# Patient Record
Sex: Male | Born: 1935 | Race: White | Hispanic: No | Marital: Married | Smoking: Never smoker
Health system: Southern US, Community
[De-identification: ages and names within clinical notes are randomized; demographics above are authoritative.]

## PROBLEM LIST (undated history)

## (undated) DIAGNOSIS — K5730 Diverticulosis of large intestine without perforation or abscess without bleeding: Secondary | ICD-10-CM

## (undated) DIAGNOSIS — M48061 Spinal stenosis, lumbar region without neurogenic claudication: Secondary | ICD-10-CM

## (undated) DIAGNOSIS — K219 Gastro-esophageal reflux disease without esophagitis: Secondary | ICD-10-CM

## (undated) DIAGNOSIS — I429 Cardiomyopathy, unspecified: Secondary | ICD-10-CM

## (undated) DIAGNOSIS — I2510 Atherosclerotic heart disease of native coronary artery without angina pectoris: Secondary | ICD-10-CM

## (undated) DIAGNOSIS — I10 Essential (primary) hypertension: Secondary | ICD-10-CM

## (undated) DIAGNOSIS — G43909 Migraine, unspecified, not intractable, without status migrainosus: Secondary | ICD-10-CM

## (undated) DIAGNOSIS — M1990 Unspecified osteoarthritis, unspecified site: Secondary | ICD-10-CM

## (undated) DIAGNOSIS — I493 Ventricular premature depolarization: Secondary | ICD-10-CM

## (undated) DIAGNOSIS — F419 Anxiety disorder, unspecified: Secondary | ICD-10-CM

## (undated) DIAGNOSIS — Z8679 Personal history of other diseases of the circulatory system: Secondary | ICD-10-CM

## (undated) DIAGNOSIS — Z87898 Personal history of other specified conditions: Secondary | ICD-10-CM

## (undated) DIAGNOSIS — E785 Hyperlipidemia, unspecified: Secondary | ICD-10-CM

## (undated) DIAGNOSIS — I472 Ventricular tachycardia: Secondary | ICD-10-CM

## (undated) DIAGNOSIS — K279 Peptic ulcer, site unspecified, unspecified as acute or chronic, without hemorrhage or perforation: Secondary | ICD-10-CM

## (undated) DIAGNOSIS — F329 Major depressive disorder, single episode, unspecified: Secondary | ICD-10-CM

## (undated) DIAGNOSIS — Z87442 Personal history of urinary calculi: Secondary | ICD-10-CM

## (undated) DIAGNOSIS — C4490 Unspecified malignant neoplasm of skin, unspecified: Secondary | ICD-10-CM

## (undated) DIAGNOSIS — C61 Malignant neoplasm of prostate: Secondary | ICD-10-CM

## (undated) DIAGNOSIS — F32A Depression, unspecified: Secondary | ICD-10-CM

## (undated) DIAGNOSIS — I700 Atherosclerosis of aorta: Secondary | ICD-10-CM

## (undated) DIAGNOSIS — R5383 Other fatigue: Secondary | ICD-10-CM

## (undated) HISTORY — PX: COLONOSCOPY: SHX174

## (undated) HISTORY — DX: Atherosclerotic heart disease of native coronary artery without angina pectoris: I25.10

## (undated) HISTORY — DX: Hyperlipidemia, unspecified: E78.5

## (undated) HISTORY — DX: Gastro-esophageal reflux disease without esophagitis: K21.9

## (undated) HISTORY — DX: Major depressive disorder, single episode, unspecified: F32.9

## (undated) HISTORY — DX: Ventricular premature depolarization: I49.3

## (undated) HISTORY — DX: Anxiety disorder, unspecified: F41.9

## (undated) HISTORY — DX: Essential (primary) hypertension: I10

## (undated) HISTORY — DX: Ventricular tachycardia: I47.2

## (undated) HISTORY — DX: Peptic ulcer, site unspecified, unspecified as acute or chronic, without hemorrhage or perforation: K27.9

## (undated) HISTORY — DX: Cardiomyopathy, unspecified: I42.9

## (undated) HISTORY — PX: CARDIAC CATHETERIZATION: SHX172

## (undated) HISTORY — DX: Depression, unspecified: F32.A

---

## 1997-07-15 ENCOUNTER — Observation Stay (HOSPITAL_COMMUNITY): Admission: RE | Admit: 1997-07-15 | Discharge: 1997-07-16 | Payer: Self-pay | Admitting: Urology

## 1998-01-14 HISTORY — PX: KIDNEY STONE SURGERY: SHX686

## 2008-09-06 ENCOUNTER — Telehealth (INDEPENDENT_AMBULATORY_CARE_PROVIDER_SITE_OTHER): Payer: Self-pay | Admitting: *Deleted

## 2008-09-06 ENCOUNTER — Encounter (INDEPENDENT_AMBULATORY_CARE_PROVIDER_SITE_OTHER): Payer: Self-pay | Admitting: *Deleted

## 2008-09-06 ENCOUNTER — Ambulatory Visit: Payer: Self-pay | Admitting: Cardiovascular Disease

## 2008-10-04 ENCOUNTER — Ambulatory Visit: Payer: Self-pay | Admitting: Cardiovascular Disease

## 2009-02-03 ENCOUNTER — Ambulatory Visit: Payer: Self-pay | Admitting: Cardiovascular Disease

## 2009-07-06 ENCOUNTER — Ambulatory Visit: Payer: Self-pay | Admitting: Cardiovascular Disease

## 2009-07-10 ENCOUNTER — Telehealth: Payer: Self-pay | Admitting: Cardiovascular Disease

## 2009-12-25 ENCOUNTER — Ambulatory Visit: Payer: Self-pay | Admitting: Cardiovascular Disease

## 2010-02-15 NOTE — Progress Notes (Signed)
  Phone Note Outgoing Call   Call placed by: Dessie Coma,  July 10, 2009 3:00 PM Call placed to: Patient Summary of Call: LMVM-notified patient per Dr. Freida Busman, Chest X-Ray was normal.

## 2010-05-23 ENCOUNTER — Other Ambulatory Visit: Payer: Self-pay | Admitting: Cardiovascular Disease

## 2010-05-24 ENCOUNTER — Other Ambulatory Visit: Payer: Self-pay | Admitting: *Deleted

## 2010-05-24 ENCOUNTER — Encounter: Payer: Self-pay | Admitting: Cardiovascular Disease

## 2010-05-24 MED ORDER — SIMVASTATIN 40 MG PO TABS: 40.0000 mg | ORAL_TABLET | Freq: Every evening | ORAL | Status: DC

## 2010-05-29 NOTE — Assessment & Plan Note (Signed)
[facility]                        [name]   Tristan Kennedy, Tristan Kennedy                      MRN:          [id number]  DATE:02/03/2009                            DOB:          Mar 17, 1934    PROBLEM LIST:  1. Nonobstructive coronary artery disease.  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  Most recent ejection      fraction in March 2010 showed ejection fraction of 50-55%, this      with mild mitral regurgitation and mild tricuspid regurgitation.  2. Hyperlipidemia.  3. Hypertension.  4. Gastroesophageal reflux disease.  5. Possible vitamin D deficiency.   INTERVAL HISTORY:  The patient states since his last visit, he has been  getting along quite well.  Overall, he feels better with myalgias in the  lower extremities after the decrease in his Coreg and simvastatin  dosages.  He continues to work full-time and occasionally experiences  discomfort in his lower legs after he has been walking up and down  ladders all day at his job.  Also, the patient states that he fell a  couple of weeks ago and has had some lower back pain for which his  primary care is following him for.  Other than that, the patient is  without complaint and has been compliant with this medications.   PHYSICAL EXAMINATION:  VITAL SIGNS:  Blood pressure is 115/68, pulse is  59, he weighs 201 pounds which is what he weighed in September.  He is  sating 96% on room air.  GENERAL:  He is in no acute distress.  HEENT:  Normocephalic, atraumatic.  NECK:  Supple.  No carotid bruit.  No JVD.  HEART:  Regular rate and rhythm without murmur, rub or gallop.  LUNGS:  Clear bilaterally.  ABDOMEN:  Soft, nontender, nondistended.  EXTREMITIES:  Without edema.  SKIN:  Warm and dry.  Pulses are 2+  bilateral carotid, radial and  dorsalis pedis.   I reviewed the patient's labs dated December 27, 2008, total cholesterol  158, HDL 44, triglycerides 141, LDL 86.   ASSESSMENT AND PLAN:  1. Nonobstructive coronary artery disease.  The patient is not having      any symptoms consistent with angina.  He should continue on aspirin      81 mg daily, statin, and beta-blocker.  I strongly encouraged the      patient to begin an exercise routine where he is performing some      sort of aerobic activity at least 30 minutes every day.  He      acknowledges this and states that he will make an effort in this      direction.  2. Hyperlipidemia.  I am happy with his LDL of 85 on simvastatin 40      mg.  He had some issues tolerating higher dose.  Because he still  occasionally gets some discomfort in his lower extremities after      using them all the day, we recommended a statin holiday for 2      weeks.  After the 2 weeks, he should restart the medication and see      if this provides any additional relief to his minimal lower      extremity discomfort.  He has excellent pulses in his periphery on      exam and I do not think that he has significant peripheral arterial      disease.  3. Hypertension.  His blood pressure is under excellent control and he      should continue on medications as listed in the chart.  We will see      the patient back in 4 months' time.     Brayton El, MD  Electronically Signed    SGA/MedQ  DD: 02/03/2009  DT: 02/04/2009  Job #: [id number]

## 2010-05-29 NOTE — Assessment & Plan Note (Signed)
Tristan Kennedy                        [facility] CARDIOLOGY OFFICE NOTE   Tristan Kennedy, Tristan Kennedy                      MRN:          [id number]  DATE:10/04/2008                            DOB:          [birth year]    PROBLEM LIST:  1. Hyperlipidemia with a history of Lipitor-induced myalgias  2. Hypertension.  3. Nonobstructive coronary artery disease, left heart catheterization      dated February 17, 2007, showed 25% mid left anterior descending      lesion, right dominant system, and an ejection fraction of 30% that      has been attributed to a sepsis-induced cardiomyopathy at that      time.  Most recent echocardiogram in March 2010 showed an ejection      fraction of 50-55%, mild mitral regurgitation, mild tricuspid      regurgitation, and normal pulmonary pressures.  4. Gastroesophageal reflux disease.  5. Possible B12 deficiency.   INTERVAL HISTORY:  The patient states since last visit, he has been  doing fairly well.  He stopped the 80 mg of simvastatin that he was  taking for 2 weeks.  He states that this improved the bilateral leg  aching that he was experiencing.  He then restarted the simvastatin at  half dose at 40 mg daily and states that the improvement that he  initially experienced did indeed continue.  He states he currently has  some bilateral leg aching at the end of the day after working hard.  However, it is better than it was before.  He states he does a lot of  work that involves climbing ladders, believes this is the likely  etiology.  He denies any chest pain, shortness of breath, syncope or  lower extremity edema.  He does bring in a blood pressure log today that  shows blood pressures ranging from 126/67 to 143/62, most of the  systolic blood pressures being in the 130s.  His heart rate during these  recordings range from 44-63 with several of the readings being in the  40s and low 50s.  Of note, the patient does state since he  was started  on the Coreg, he has felt mildly weak.  He also endorses occasional  dizziness if he raises his head up too quickly.   PHYSICAL EXAMINATION:  VITAL SIGNS:  The patient has blood pressure of  149/79; pulse is 48; weight is 201 pounds, just 2 pounds more than he  weighted a month ago; and he is sating 97% on room air.  GENERAL:  He is in no acute distress.  HEENT:  Nonfocal.  HEART:  Regular rate and rhythm without murmur, rub or gallop.  LUNGS:  Clear bilaterally.  ABDOMEN:  Soft, nontender, nondistended.  EXTREMITIES:  Without edema.   LABORATORY FINDINGS:  Labs from August 24 show a HDL of 39,  triglycerides 621, LDL of 82, total cholesterol 155.  His AST is 27, ALT  23, albumin 3.9, total protein 6.7, total bili 0.5.  His CPK was 58.   ASSESSMENT AND PLAN:  The patient is doing well from a cardiovascular  standpoint.  It appears that he is tolerating the lower dose of  simvastatin much better than the higher dose, so we will continue him at  the 40 mg daily.  His LFTs and CPK were totally within normal limits  when he was feeling the myalgias initially.  The patient's vital sign  log from home indicates that he is mildly bradycardic and the patient  does have some occasional associated dizziness and weakness from it.  Therefore, we will decrease his Coreg from 6.25 to 3.125 mg b.i.d.  He  should continue on his other medications as listed in the chart.  I will  see the patient back in 3 months' time with followup CPK and fasting  lipids.     Brayton El, MD  Electronically Signed    SGA/MedQ  DD: 10/04/2008  DT: 10/05/2008  Job #: [id number]

## 2010-05-29 NOTE — Assessment & Plan Note (Signed)
[facility]                        [place] CARDIOLOGY OFFICE NOTE   Tristan Kennedy, Tristan Kennedy                      MRN:          [id number]  DATE:09/06/2008                            DOB:          07-11-1934    CHIEF COMPLAINT:  Muscle aches.   HISTORY OF PRESENT ILLNESS:  Tristan Kennedy is a 76 year old white male with  a past medical history significant for hypertension, hyperlipidemia,  myalgias that had been attributed to statin use, frequent PVCs on a  stress EKG, and minimal nonobstructive coronary artery disease who is  presenting with a several-week history of increased muscle aches.  The  patient states that oftentimes at the end of the day he feels muscle  aching in his bilateral thighs, right greater than left, and some in the  front of his legs.  More recently his bilateral arms have also been  aching mildly at the end of the day.  He states that these muscle aches  seem to coincide with an increase in his simvastatin from 40 mg to 80 mg  nightly.  Of note, the patient states he had problems tolerating Lipitor  in the past and that every time Lipitor was instituted he had whole body  aches that he describes as worse than the muscle aches that he has been  experiencing over the past several weeks.  The patient denies any chest  discomfort, shortness of breath, dyspnea on exertion, lower extremity  edema, PND, orthopnea, or syncope.  The patient states that the only  recent change in his medication other then an increase in simvastatin  was an increase in his fish oil dose.  The patient states he was suppose  to increase his Coreg, however did not do so because he has also been  experiencing some mild generalized weakness and did not want to increase  the weakness by increasing his dose of beta-blocker.   PAST MEDICAL HISTORY:  As above in the HPI.  In addition, the patient  has a history of diverticulitis and was admitted to the hospital with a  flare-up of this in March of 2010.  He carries a history of  cardiomyopathy that was associated with sepsis, however, a 2-D  echocardiogram in March of this year demonstrated an ejection fraction  of 50-55% with impaired relaxation, mild MR, mild TR, and pulmonary  pressure estimated to be normal.  The patient also carries a diagnosis  of gastroesophageal reflux disease, possible B12 deficiency, history of  elevated LFTs that appeared to be non-viral in etiology.  The patient  has a history of non-sustained v-tach on a Holter monitor he wore in  February of 2009, of note at this time his ejection fraction was 30%.   SOCIAL HISTORY:  No tobacco and very rare alcohol use.   FAMILY HISTORY:  Negative for premature coronary artery disease.   ALLERGIES:  NO KNOWN DRUG ALLERGIES.   MEDICATIONS:  1. Aspirin 81mg  daily.  2. Coreg 6.25 mg b.i.d.  3. Moexipril/HCTZ 15/12.5 mg 1 tablet daily.  4. Fish oil 1200 mg 3 tablets daily.  5. Omeprazole 20 mg daily.  6. Ranitidine 300 mg at bedtime.  7. Sertraline 50 mg b.i.d.  8. Trazodone 150 mg at bedtime.  9. Multivitamin.  10.Magnesium.  11.Hydrocodone p.r.n.  12.Alprazolam p.r.n.  13.Pro-Air 90 mcg 2 puffs q.4 hours p.r.n.   REVIEW OF SYSTEMS:  As in HPI.  All other systems were reviewed and are  negative.  The patient does complain of somewhat of a chronic cough;  however, the cough only occurs in the morning upon waking and in the  late evenings.   PHYSICAL EXAMINATION:  VITAL SIGNS: Blood pressure 134/82, pulse 58,  weighs 199 pounds, sating 95% on room air.  GENERAL:  In no acute distress.  HEENT:  Nonfocal.  NECK:  Supple.  There is no JVD.  There are no carotid bruits.  There is  no lymphadenopathy.  HEART:  Regular rate and rhythm without murmur, rub, or gallop.  LUNGS:  Clear to auscultation bilaterally.  ABDOMEN: Soft, nontender, and nondistended.  There are no abdominal  bruits present.  EXTREMITIES:  No edema.  SKIN:  Warm  and dry.  PULSES:  The patient has 2+ bilateral carotid, radial, popliteal, and  posterior tibial pulses.  NEURO:  Nonfocal.  PSYCH:  The patient is appropriate with normal levels of insight.   LABORATORY DATA:  EKG independently reviewed by myself demonstrates a  mild sinus bradycardia at a rate of 58 beats per minute.  There are no  significant ST or T-waves abnormalities.  Review of the patient's labs  from May of this year shows a HDL of 30, a LDL of 96, triglyceride level  of 329, a total cholesterol of 192.  Labs from July of this year shows a  CBC that had a white count of 5, hemoglobin of 14, and a platelet count  of 132.  His BMP at that time was completely within normal limits  including a creatinine of 1.2.  His LFTs showed an ALT of 24, AST of 32,  albumin 4.2, alkaline phosphatase 67.  TSH was 1.9.  Review of patient's  transthoracic echocardiogram as above in HPI.  Review of the patient's  heart catheterization report from February 17, 2007 showed the patient  had a mid LAD 25% eccentric lesion.  There is a right dominant system.  EF at that time was 30% (that had been attributed to sepsis-induced  cardiomyopathy).  Review of a Holter monitor dated February of 2009  showed a max heart rate of 87, minimal heart rate of 36 at 3:30 in the  morning, average heart rate of 55.  There were no significant pauses.  There were frequent PVCs and brief episodes of non-sustained v-tach.   ASSESSMENT:  This is a 76 year old white male with a history of systolic  dysfunction associated with a sepsis per report.  His ejection fraction  has now normalized.  He is complaining of muscle aches that may be  secondary to a statin-induced myopathy. Other then the muscle aches  patient is getting along quite well.  He is not having any signs or  symptoms consistent with obstructive coronary artery disease or  malignant arrhythmia. He is on a good medical regimen and his left  ventricular ejection  fraction is normal.   PLAN:  Today in clinic we will ask the patient to stop his simvastatin  for two weeks.  At that time, he should restart it, however, at a dose  of 40 mg every evening as oppose to the 80 mg that he is taking now.  Today we will check a fasting lipid profile, a total CPK, and LFTs.  He  should continue on his current medical regimen as listed above and he  should also remain well hydrated.  Of note, the patient does have a  cough and is on an ACE inhibitor; however, this cough is more likely to  be related to the reflux as it occurs in the very early morning and  often at night after a meal.  If his cough was to worsen or persist  chronically we would consider altering his anti-hypertensive regimen.     Brayton El, MD  Electronically Signed    SGA/MedQ  DD: 09/06/2008  DT: 09/06/2008  Job #: [id number]

## 2010-05-29 NOTE — Assessment & Plan Note (Signed)
[facility]                         CARDIOLOGY OFFICE NOTE   Tristan Kennedy, Tristan Kennedy                      MRN:          [id number]  DATE:07/06/2009                            DOB:          03-Jan-1935    PROBLEMS LIST:  1. Nonobstructive coronary artery disease.  Left heart catheterization      dated February 17, 2007, showed mid left anterior descending      coronary artery 25%.  Ejection fraction was decreased at that time;      however, the most recent echo in March 2010 showed an ejection      fraction of 50-55%.  The initial echo showing systolic dysfunction,      was thought to be secondary to sepsis-induced cardiomyopathy.  2. Hyperlipidemia.  3. Hypertension.  4. Gastroesophageal reflux disease.  5. Possible vitamin D deficiency.   INTERVAL HISTORY:  The patient states that he has been getting along  well.  He continues to work full-time and has no difficulty with his  activities.  He states that he is very active during work.  He denies  any chest discomfort, shortness of breath, or discomfort in his  extremities.  The patient does endorse approximate 74-month history of  evening cough.  He states that the cough often starts around 4:30 in the  afternoon, continues to the evening, it is nonproductive, and it does  not occur everyday.  Of note, he has been on an ACE inhibitor for  several years without difficulty.   SOCIAL HISTORY:  The patient does not smoke tobacco.   PHYSICAL EXAMINATION:  VITAL SIGNS:  Weight is 203, blood pressure is  130/82, pulse is 52, saturating 97% on room air.  GENERAL:  No acute distress.  HEENT:  Normocephalic, atraumatic.  NECK:  Supple.  There is no JVD.  No carotid bruits.  HEART:  Regular rate and rhythm without murmur, rub, or gallop.  LUNGS:  Clear bilaterally.  ABDOMEN:  Soft, nontender, nondistended.  EXTREMITIES:  Without edema.  SKIN:  Warm and dry.  PSYCHIATRIC:  The patient is  appropriate.   The patient's last lipid profile in December 2010 showed a HDL 44, LDL  86, total cholesterol 158, triglycerides 141.   ASSESSMENT AND PLAN:  1. Nonobstructive coronary disease.  The patient is not having any      symptoms consistent with angina, arrhythmia, or heart failure.  He      should continue on aspirin 81 mg daily, simvastatin, low-dose      Coreg, beta-blocker, and fish oil.  2. Cough.  I think unlikely that the patient's cough is secondary to      his ACE inhibitor, however, we will stop this medication for 1      weeks' time.  If the cough resolves, he will contact our office and      we will choose a different antihypertensive agent.  Of note, he is      currently taking moexipril/hydrochlorothiazide 15/12.5 two tablets      daily.  I will check a PA and lateral chest x-ray.  The patient has  some history of potential asbestos exposure.  He also has      gastroesophageal reflux disease, but if the alteration of his ACE      inhibitor does not help, it may be worthwhile increasing his      omeprazole to b.i.d..  He should contact his primary care Tristan Kennedy      regarding this.  3. Hyperlipidemia.  He is not having any myalgias with the simvastatin      40 mg daily and his last LDL was 85 which is reasonable.  He should      continue on this current dose.  4. Hypertension.  His blood pressure is under good control.  He should      continue the current medications except for the above alteration as      above related to his cough.  The patient will follow up in 6      months' time.     Brayton El, MD  Electronically Signed    SGA/MedQ  DD: 07/06/2009  DT: 07/06/2009  Job #: [id number]

## 2010-08-03 ENCOUNTER — Other Ambulatory Visit: Payer: Self-pay | Admitting: Cardiovascular Disease

## 2010-12-26 ENCOUNTER — Encounter: Payer: Self-pay | Admitting: Cardiovascular Disease

## 2014-08-15 DIAGNOSIS — E785 Hyperlipidemia, unspecified: Secondary | ICD-10-CM | POA: Insufficient documentation

## 2014-08-15 DIAGNOSIS — I10 Essential (primary) hypertension: Secondary | ICD-10-CM

## 2014-08-15 DIAGNOSIS — I493 Ventricular premature depolarization: Secondary | ICD-10-CM

## 2014-08-15 DIAGNOSIS — I2510 Atherosclerotic heart disease of native coronary artery without angina pectoris: Secondary | ICD-10-CM

## 2014-08-15 HISTORY — DX: Atherosclerotic heart disease of native coronary artery without angina pectoris: I25.10

## 2014-08-15 HISTORY — DX: Ventricular premature depolarization: I49.3

## 2014-08-15 HISTORY — DX: Hyperlipidemia, unspecified: E78.5

## 2014-08-15 HISTORY — DX: Essential (primary) hypertension: I10

## 2015-10-13 ENCOUNTER — Other Ambulatory Visit: Payer: Self-pay | Admitting: Rehabilitation

## 2015-10-13 DIAGNOSIS — M5126 Other intervertebral disc displacement, lumbar region: Secondary | ICD-10-CM

## 2015-10-24 ENCOUNTER — Ambulatory Visit
Admission: RE | Admit: 2015-10-24 | Discharge: 2015-10-24 | Disposition: A | Discharge status: Home / Self Care | Payer: Medicare Other | Source: Ambulatory Visit | Attending: Rehabilitation | Admitting: Rehabilitation

## 2015-10-24 DIAGNOSIS — M5126 Other intervertebral disc displacement, lumbar region: Secondary | ICD-10-CM

## 2016-08-14 ENCOUNTER — Ambulatory Visit (INDEPENDENT_AMBULATORY_CARE_PROVIDER_SITE_OTHER): Payer: Medicare Other

## 2016-08-14 ENCOUNTER — Ambulatory Visit (INDEPENDENT_AMBULATORY_CARE_PROVIDER_SITE_OTHER): Payer: Medicare Other | Admitting: Orthopaedic Surgery

## 2016-08-14 DIAGNOSIS — G8929 Other chronic pain: Secondary | ICD-10-CM | POA: Diagnosis not present

## 2016-08-14 DIAGNOSIS — M25511 Pain in right shoulder: Secondary | ICD-10-CM

## 2016-08-14 MED ORDER — METHYLPREDNISOLONE ACETATE 40 MG/ML IJ SUSP
40.0000 mg | INTRAMUSCULAR | Status: AC | PRN
  Administered 2016-08-14: 40 mg via INTRA_ARTICULAR

## 2016-08-14 MED ORDER — LIDOCAINE HCL 1 % IJ SOLN
3.0000 mL | INTRAMUSCULAR | Status: AC | PRN
  Administered 2016-08-14: 3 mL

## 2016-08-14 NOTE — Progress Notes (Signed)
Office Visit Note   Patient: Tristan Kennedy           Date of Birth: 1934-03-06           MRN: [id number] Visit Date: 08/14/2016              Requested by: No referring provider defined for this encounter. PCP: Default, Provider, MD   Assessment & Plan: Visit Diagnoses:  1. Chronic right shoulder pain     Plan: He is definitely want to try steroid injection in his right shoulder. I agree with this as well. I talked about physical therapy as well but he also try to see what the injection does. He'll follow up as needed however if 3-4 weeks ago by me still having pain and not improving his shoulder range of motion I would want him to call so we can set him up for outpatient physical therapy for his right shoulder. All questions were encouraged and answered.  Follow-Up Instructions: Return in about 4 weeks (around 09/11/2016).   Orders:  Orders Placed This Encounter  Procedures  . Large Joint Injection/Arthrocentesis  . Large Joint Injection/Arthrocentesis  . XR Shoulder Right   No orders of the defined types were placed in this encounter.     Procedures: Large Joint Inj Date/Time: 08/14/2016 11:03 AM Performed by: Mcarthur Rossetti Authorized by: Mcarthur Rossetti   Location:  Shoulder Site:  R subacromial bursa Ultrasound Guidance: No   Fluoroscopic Guidance: No   Arthrogram: No   Medications:  3 mL lidocaine 1 %; 40 mg methylPREDNISolone acetate 40 MG/ML     Clinical Data: No additional findings.   Subjective: No chief complaint on file. The patient comes in today with his family. I know his family well. He is someone who slipped and fell on the ice back in January injuring his right shoulder. He is tried leg go for long period time it has gotten word hurts him with overhead activities and reaching behind him. He denies any neck pain denies any radicular symptoms going down the right arm is just in his shoulder where he points to pain. He said reaching  behind him and overhead causes the most problems. He says there is some weakness as well.  HPI  Review of Systems He denies any headache, chest pain, shortness of breath, fever, chills, nausea, vomiting.  Objective: Vital Signs: There were no vitals taken for this visit.  Physical Exam He is alert and oriented 3 and in no acute distress Ortho Exam Examination of his right shoulder shows that he's not having to use his deltoids to abduct the shoulder but there is definitely weakness with abduction as well as external rotation. His internal rotation with adduction is only just above the lumbar spine area were incised full on his non- injured left side. His neck exam is normal. His distal motor and sensory exam in his right upper extremity is normal. He does have a negative liftoff of the right shoulder as well. Specialty Comments:  No specialty comments available.  Imaging: Xr Shoulder Right  Result Date: 08/14/2016 3 views of the right shoulder show no acute findings. The shoulders well located. The humeral head is in a central position in the glenoid. There is no evidence of rotator cuff arthropathy.    PMFS History: There are no active problems to display for this patient.  Past Medical History:  Diagnosis Date  . CAD (coronary artery disease)   . GERD (gastroesophageal reflux disease)   .  HLD (hyperlipidemia)   . HTN (hypertension)     Family History  Problem Relation Age of Onset  . Stroke Unknown     Past Surgical History:  Procedure Laterality Date  . KIDNEY STONE SURGERY  2000   Social History   Occupational History  . maintance    Social History Main Topics  . Smoking status: Former Smoker    Quit date: 01/14/1961  . Smokeless tobacco: Not on file  . Alcohol use Not on file  . Drug use: Unknown  . Sexual activity: Not on file

## 2016-08-28 ENCOUNTER — Other Ambulatory Visit: Payer: Self-pay

## 2016-08-28 MED ORDER — SIMVASTATIN 40 MG PO TABS: 40.0000 mg | ORAL_TABLET | Freq: Every day | ORAL | 1 refills | Status: DC

## 2016-09-11 ENCOUNTER — Ambulatory Visit (INDEPENDENT_AMBULATORY_CARE_PROVIDER_SITE_OTHER): Payer: Medicare Other | Admitting: Physician Assistant

## 2016-09-11 DIAGNOSIS — M25511 Pain in right shoulder: Secondary | ICD-10-CM | POA: Diagnosis not present

## 2016-09-11 NOTE — Progress Notes (Signed)
Mr. Tristan Kennedy returns today follow-up of his right shoulder status post subacromial injection. He states injection really was helpful. He is sleeping better. He is still mindful of the shoulder mostly uses left arm. He denies any pain in the arm this point in time. He did do some of the exercises that were shown to him by Dr. Ninfa Linden.  Physical exam: Bilateral shoulders he has 5 out of 5 strengths with internal rotation against resistance. 4 out of 5 strengths with external rotation on the right 5 out of 5 on the left. Positive impingement sign on the right. Forward flexion of the right shoulder without significant pain.  Impression: Right shoulder impingement  Plan: Discussed with him some formal physical therapy defers. Therefore he is given therapy band and review some external and internal rotation exercises with him. Continue the other exercises shown by Dr. Ninfa Linden. See him back on an as-needed basis. If his pain persist or becomes worse we'll obtain an MRI to evaluate his rotator cuff. Questions encouraged and answered length today.

## 2016-12-12 ENCOUNTER — Other Ambulatory Visit: Payer: Self-pay

## 2016-12-12 DIAGNOSIS — I429 Cardiomyopathy, unspecified: Secondary | ICD-10-CM

## 2016-12-12 DIAGNOSIS — I2510 Atherosclerotic heart disease of native coronary artery without angina pectoris: Secondary | ICD-10-CM | POA: Insufficient documentation

## 2016-12-12 DIAGNOSIS — I472 Ventricular tachycardia, unspecified: Secondary | ICD-10-CM

## 2016-12-12 DIAGNOSIS — I4729 Other ventricular tachycardia: Secondary | ICD-10-CM

## 2016-12-12 HISTORY — DX: Ventricular tachycardia: I47.2

## 2016-12-12 HISTORY — DX: Other ventricular tachycardia: I47.29

## 2016-12-12 HISTORY — DX: Cardiomyopathy, unspecified: I42.9

## 2016-12-12 HISTORY — DX: Ventricular tachycardia, unspecified: I47.20

## 2016-12-13 NOTE — Progress Notes (Signed)
Cardiology Office Note:    Date:  12/16/2016   ID:  Tristan Kennedy, DOB 1934-02-18, MRN [id number]  PCP:  Cyndy Freeze, MD  Cardiologist:  Shirlee More, MD    Referring MD: Cyndy Freeze, MD    ASSESSMENT:    1. Mild CAD   2. Frequent PVCs   3. Essential hypertension   4. Hyperlipidemia, unspecified hyperlipidemia type    PLAN:    In order of problems listed above:  1. Stable, I requested a copy of his coronary angiogram done 5-10 years ago he is having no anginal discomfort he will continue his current medical treatment.  At this time I would not advise an ischemia evaluation 2. Stable he has little or no palpitation tolerates minimal dose of beta-blocker ACE peel-off 5 days a week with home heart rates running greater than 50-60 bpm. 3. Stable blood pressure target continue current treatment including ACE inhibitor diuretic and check renal function today 4. Stable continue statin CAD check lipid profile I will send a copy of his labs to his PCP    Next appointment: One year   Medication Adjustments/Labs and Tests Ordered: Current medicines are reviewed at length with the patient today.  Concerns regarding medicines are outlined above.  Orders Placed This Encounter  Procedures  . Comprehensive Metabolic Panel (CMET)  . Lipid Profile   No orders of the defined types were placed in this encounter.   Chief Complaint  Patient presents with  . Follow-up    PVC's  . Coronary Artery Disease    History of Present Illness:    Tristan Kennedy is a 82 y.o. male with a hx of CAD, Dyslipidemia, HTN, and PVC's  last seen in October 2017. Compliance with diet, lifestyle and medications: Yes He remains vigorous active has little or no palpitation no exercise intolerance chest pain dyspnea syncope or TIA.  His wife checks blood pressure and heart rate sporadically heart rates greater than 50-60 bpm.  He relates he is overdue for lab work will be performed today he is  fasting Past Medical History:  Diagnosis Date  . Anxiety   . CAD (coronary artery disease)   . Cardiomyopathy ([place]) 12/12/2016   EF 50% in July 2012 by echo    . Depression   . Essential hypertension 08/15/2014  . 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 GERD (gastroesophageal reflux disease)   . HLD (hyperlipidemia)   . HTN (hypertension)   . Hyperlipidemia 08/15/2014  . Kidney stone   . Mild CAD 08/15/2014  . Paroxysmal VT ([place]) 12/12/2016  . PUD (peptic ulcer disease)     Past Surgical History:  Procedure Laterality Date  . CARDIAC CATHETERIZATION    . KIDNEY STONE SURGERY  2000    Current Medications: Current Meds  Medication Sig  . acebutolol (SECTRAL) 200 MG capsule Take 200 mg by mouth daily. He takes it 5 days a week  . aspirin 81 MG tablet Take 81 mg by mouth daily.    Marland Kitchen Bioflavonoid Products (ESTER-C) 500-50 MG TABS Take by mouth daily.    . busPIRone (BUSPAR) 15 MG tablet TAKE 1/2 TABLET TWICE DAILY  . cyclobenzaprine (FLEXERIL) 5 MG tablet Take 5 mg by mouth 2 (two) times daily as needed.  . DOCOSAHEXAENOIC ACID PO Take 2 g by mouth daily.  Marland Kitchen lisinopril-hydrochlorothiazide (PRINZIDE,ZESTORETIC) 10-12.5 MG tablet TAKE ONE TABLET BY MOUTH DAILY OMIT MONDAY AND FRIDAY  . magnesium oxide (MAG-OX) 400 MG tablet Take 800 mg by mouth daily.    Marland Kitchen  Multiple Vitamin (MULTIVITAMIN) tablet Take 1 tablet by mouth daily.    . Omega-3 Fatty Acids (FISH OIL) 1200 MG CAPS Take 3,600 mg by mouth at bedtime.    Marland Kitchen omeprazole (PRILOSEC) 20 MG capsule Take 20 mg by mouth daily.    . psyllium (REGULOID) 0.52 G capsule Take 0.52 g by mouth 2 (two) times daily.    . ranitidine (ZANTAC) 300 MG capsule Take 300 mg by mouth every evening.    . sertraline (ZOLOFT) 50 MG tablet Take 50 mg by mouth 2 (two) times daily.    . simvastatin (ZOCOR) 40 MG tablet Take 1 tablet (40 mg total) by mouth at bedtime.  . traMADol (ULTRAM) 50 MG tablet TAKE ONE TABLET BY MOUTH EVERY TWELVE HOURS AS NEEDED  . vitamin C  (ASCORBIC ACID) 500 MG tablet Take 500 mg by mouth.  . Vitamin D, Ergocalciferol, (DRISDOL) 50000 units CAPS capsule Take 1 capsule by mouth once a week.  . [DISCONTINUED] Wheat Dextrin (BENEFIBER DRINK MIX PO) Take by mouth.     Allergies:   Patient has no known allergies.   Social History   Socioeconomic History  . Marital status: Married    Spouse name: None  . Number of children: 2  . Years of education: None  . Highest education level: None  Social Needs  . Financial resource strain: None  . Food insecurity - worry: None  . Food insecurity - inability: None  . Transportation needs - medical: None  . Transportation needs - non-medical: None  Occupational History  . Occupation: maintance  Tobacco Use  . Smoking status: Former Smoker    Last attempt to quit: 01/14/1961    Years since quitting: 55.9  . Smokeless tobacco: Never Used  Substance and Sexual Activity  . Alcohol use: No    Frequency: Never  . Drug use: No  . Sexual activity: None  Other Topics Concern  . None  Social History Narrative  . None     Family History: The patient's family history includes Diabetes in his unknown relative; Hypertension in his unknown relative; Stroke in his unknown relative. ROS:   Please see the history of present illness.    All other systems reviewed and are negative.  EKGs/Labs/Other Studies Reviewed:    The following studies were reviewed today:  EKG:  EKG ordered today.  The ekg ordered today demonstrates [place] 1 PVC and NSIVCD QRS 134 msec  Recent Labs: No results found for requested labs within last 8760 hours.  Recent Lipid Panel No results found for: CHOL, TRIG, HDL, CHOLHDL, VLDL, LDLCALC, LDLDIRECT  and EF 50%  Physical Exam:    VS:  BP 110/70 (BP Location: Right Arm, Patient Position: Sitting, Cuff Size: Normal)   Pulse (!) 54   Ht 5\' 9"  (1.753 m)   Wt 195 lb (88.5 kg)   SpO2 98%   BMI 28.80 kg/m     Wt Readings from Last 3 Encounters:  12/16/16 195 lb  (88.5 kg)     GEN: No PVCs during examination well nourished, well developed in no acute distress HEENT: Normal NECK: No JVD; No carotid bruits LYMPHATICS: No lymphadenopathy CARDIAC: RRR, no murmurs, rubs, gallops RESPIRATORY:  Clear to auscultation without rales, wheezing or rhonchi  ABDOMEN: Soft, non-tender, non-distended MUSCULOSKELETAL:  No edema; No deformity  SKIN: Warm and dry NEUROLOGIC:  Alert and oriented x 3 PSYCHIATRIC:  Normal affect    Signed, Shirlee More, MD  12/16/2016 3:29 PM  [facility]

## 2016-12-16 ENCOUNTER — Encounter: Payer: Self-pay | Admitting: Cardiology

## 2016-12-16 ENCOUNTER — Ambulatory Visit: Payer: Medicare Other | Admitting: Cardiology

## 2016-12-16 VITALS — BP 110/70 | HR 54 | Ht 69.0 in | Wt 195.0 lb

## 2016-12-16 DIAGNOSIS — I493 Ventricular premature depolarization: Secondary | ICD-10-CM | POA: Diagnosis not present

## 2016-12-16 DIAGNOSIS — I2510 Atherosclerotic heart disease of native coronary artery without angina pectoris: Secondary | ICD-10-CM

## 2016-12-16 DIAGNOSIS — E785 Hyperlipidemia, unspecified: Secondary | ICD-10-CM

## 2016-12-16 DIAGNOSIS — I10 Essential (primary) hypertension: Secondary | ICD-10-CM | POA: Diagnosis not present

## 2016-12-16 NOTE — Patient Instructions (Signed)
Medication Instructions:  Your physician recommends that you continue on your current medications as directed. Please refer to the Current Medication list given to you today.  Labwork: Your physician recommends that you have the following labs drawn: CMP and lipid panel  Testing/Procedures: None  Follow-Up: Your physician recommends that you schedule a follow-up appointment in: 1 year  Any Other Special Instructions Will Be Listed Below (If Applicable).     If you need a refill on your cardiac medications before your next appointment, please call your pharmacy.   [place], RN, BSN

## 2016-12-17 ENCOUNTER — Other Ambulatory Visit: Payer: Self-pay

## 2016-12-17 LAB — COMPREHENSIVE METABOLIC PANEL
A/G RATIO: 2.1 (ref 1.2–2.2)
ALT: 16 IU/L (ref 0–44)
AST: 19 IU/L (ref 0–40)
Albumin: 4.9 g/dL — ABNORMAL HIGH (ref 3.5–4.7)
Alkaline Phosphatase: 53 IU/L (ref 39–117)
BUN / CREAT RATIO: 16 (ref 10–24)
BUN: 27 mg/dL (ref 8–27)
Bilirubin Total: 0.7 mg/dL (ref 0.0–1.2)
CHLORIDE: 103 mmol/L (ref 96–106)
CO2: 25 mmol/L (ref 20–29)
CREATININE: 1.69 mg/dL — AB (ref 0.76–1.27)
Calcium: 9.5 mg/dL (ref 8.6–10.2)
GFR, EST AFRICAN AMERICAN: 43 mL/min/{1.73_m2} — AB (ref >59)
GFR, EST NON AFRICAN AMERICAN: 38 mL/min/{1.73_m2} — AB (ref >59)
GLOBULIN, TOTAL: 2.3 g/dL (ref 1.5–4.5)
Glucose: 83 mg/dL (ref 65–99)
Potassium: 4.5 mmol/L (ref 3.5–5.2)
Sodium: 144 mmol/L (ref 134–144)
Total Protein: 7.2 g/dL (ref 6.0–8.5)

## 2016-12-17 LAB — LIPID PANEL
CHOL/HDL RATIO: 3 ratio (ref 0.0–5.0)
Cholesterol, Total: 146 mg/dL (ref 100–199)
HDL: 48 mg/dL (ref >39)
LDL CALC: 66 mg/dL (ref 0–99)
Triglycerides: 162 mg/dL — ABNORMAL HIGH (ref 0–149)
VLDL CHOLESTEROL CAL: 32 mg/dL (ref 5–40)

## 2016-12-17 MED ORDER — LISINOPRIL 10 MG PO TABS: 10.0000 mg | ORAL_TABLET | Freq: Every day | ORAL | 0 refills | Status: DC

## 2016-12-17 NOTE — Addendum Note (Signed)
Addended by: Mattie Marlin on: 12/17/2016 10:23 AM   Modules accepted: Orders

## 2016-12-26 ENCOUNTER — Other Ambulatory Visit: Payer: Self-pay

## 2016-12-26 MED ORDER — SIMVASTATIN 40 MG PO TABS: 40.0000 mg | ORAL_TABLET | Freq: Every day | ORAL | 11 refills | Status: DC

## 2017-01-10 ENCOUNTER — Other Ambulatory Visit: Payer: Self-pay

## 2017-01-10 MED ORDER — LISINOPRIL 10 MG PO TABS: 10.0000 mg | ORAL_TABLET | Freq: Every day | ORAL | 1 refills | Status: DC

## 2017-01-20 ENCOUNTER — Telehealth: Payer: Self-pay

## 2017-01-20 NOTE — Telephone Encounter (Signed)
Patient states that he did have repeat lab work at PCP. Requesting records.

## 2017-03-20 ENCOUNTER — Other Ambulatory Visit: Payer: Self-pay

## 2017-03-20 MED ORDER — ACEBUTOLOL HCL 200 MG PO CAPS: 200.0000 mg | ORAL_CAPSULE | Freq: Every day | ORAL | 2 refills | Status: DC

## 2017-04-08 HISTORY — PX: PROSTATE BIOPSY: SHX241

## 2017-04-15 ENCOUNTER — Other Ambulatory Visit (HOSPITAL_COMMUNITY): Payer: Self-pay | Admitting: Urology

## 2017-04-15 DIAGNOSIS — C61 Malignant neoplasm of prostate: Secondary | ICD-10-CM

## 2017-04-29 ENCOUNTER — Encounter (HOSPITAL_COMMUNITY)
Admission: RE | Admit: 2017-04-29 | Discharge: 2017-04-29 | Disposition: A | Discharge status: Home / Self Care | Payer: Medicare Other | Source: Ambulatory Visit | Attending: Urology | Admitting: Urology

## 2017-04-29 DIAGNOSIS — I700 Atherosclerosis of aorta: Secondary | ICD-10-CM

## 2017-04-29 DIAGNOSIS — K5730 Diverticulosis of large intestine without perforation or abscess without bleeding: Secondary | ICD-10-CM

## 2017-04-29 DIAGNOSIS — C61 Malignant neoplasm of prostate: Secondary | ICD-10-CM | POA: Diagnosis present

## 2017-04-29 HISTORY — DX: Diverticulosis of large intestine without perforation or abscess without bleeding: K57.30

## 2017-04-29 HISTORY — DX: Atherosclerosis of aorta: I70.0

## 2017-04-29 MED ORDER — TECHNETIUM TC 99M MEDRONATE IV KIT
21.3000 | PACK | Freq: Once | INTRAVENOUS | Status: AC | PRN
  Administered 2017-04-29: 21.3 via INTRAVENOUS

## 2017-05-05 ENCOUNTER — Encounter: Payer: Self-pay | Admitting: Radiation Oncology

## 2017-05-09 DIAGNOSIS — Z9181 History of falling: Secondary | ICD-10-CM | POA: Insufficient documentation

## 2017-05-09 DIAGNOSIS — R972 Elevated prostate specific antigen [PSA]: Secondary | ICD-10-CM | POA: Insufficient documentation

## 2017-05-09 DIAGNOSIS — R5382 Chronic fatigue, unspecified: Secondary | ICD-10-CM

## 2017-05-09 DIAGNOSIS — I110 Hypertensive heart disease with heart failure: Secondary | ICD-10-CM | POA: Insufficient documentation

## 2017-05-09 DIAGNOSIS — J45909 Unspecified asthma, uncomplicated: Secondary | ICD-10-CM | POA: Insufficient documentation

## 2017-05-09 DIAGNOSIS — R519 Headache, unspecified: Secondary | ICD-10-CM | POA: Insufficient documentation

## 2017-05-09 DIAGNOSIS — R51 Headache: Secondary | ICD-10-CM

## 2017-05-09 DIAGNOSIS — F418 Other specified anxiety disorders: Secondary | ICD-10-CM | POA: Insufficient documentation

## 2017-05-09 DIAGNOSIS — F410 Panic disorder [episodic paroxysmal anxiety] without agoraphobia: Secondary | ICD-10-CM | POA: Insufficient documentation

## 2017-05-09 DIAGNOSIS — Z0000 Encounter for general adult medical examination without abnormal findings: Secondary | ICD-10-CM | POA: Insufficient documentation

## 2017-05-09 DIAGNOSIS — M5137 Other intervertebral disc degeneration, lumbosacral region: Secondary | ICD-10-CM | POA: Insufficient documentation

## 2017-05-09 DIAGNOSIS — G43009 Migraine without aura, not intractable, without status migrainosus: Secondary | ICD-10-CM | POA: Insufficient documentation

## 2017-05-09 DIAGNOSIS — I495 Sick sinus syndrome: Secondary | ICD-10-CM | POA: Insufficient documentation

## 2017-05-09 DIAGNOSIS — I119 Hypertensive heart disease without heart failure: Secondary | ICD-10-CM

## 2017-05-09 DIAGNOSIS — R5381 Other malaise: Secondary | ICD-10-CM | POA: Insufficient documentation

## 2017-05-09 DIAGNOSIS — D510 Vitamin B12 deficiency anemia due to intrinsic factor deficiency: Secondary | ICD-10-CM | POA: Insufficient documentation

## 2017-05-11 DIAGNOSIS — E782 Mixed hyperlipidemia: Secondary | ICD-10-CM | POA: Insufficient documentation

## 2017-05-11 DIAGNOSIS — F32A Depression, unspecified: Secondary | ICD-10-CM | POA: Insufficient documentation

## 2017-05-11 DIAGNOSIS — K219 Gastro-esophageal reflux disease without esophagitis: Secondary | ICD-10-CM | POA: Insufficient documentation

## 2017-05-11 DIAGNOSIS — F329 Major depressive disorder, single episode, unspecified: Secondary | ICD-10-CM | POA: Insufficient documentation

## 2017-05-27 ENCOUNTER — Encounter: Payer: Self-pay | Admitting: Radiation Oncology

## 2017-05-27 NOTE — Progress Notes (Signed)
GU Location of Tumor / Histology: prostatic adenocarcinoma  If Prostate Cancer, Gleason Score is (4 + 4) and PSA is (2.4) on 02/2017. Prostate volume: 46 cc  Tristan Kennedy prostate nodule was discovered approximately 03/03/17. His gastroenterologist found the prostate nodule. His PSA has always been normal in the past. PSA collected and proved to be 2.4.   Biopsies of prostate (if applicable) revealed:    Past/Anticipated interventions by urology, if any: biopsy, CT of pelvis (neg), bone scan (neg),  LUPRON (received - understands he will need to receive an injection every six months for 2 years), referral to radiation oncology   Past/Anticipated interventions by medical oncology, if any: no  Weight changes, if any: no  Bowel/Bladder complaints, if any: Denies dysuria, hematuria, urinary leakage or incontinence   Nausea/Vomiting, if any: no  Pain issues, if any:  Left hip and headaches. Suffered from both before diagnosis.   SAFETY ISSUES:  Prior radiation? no  Pacemaker/ICD? no  Possible current pregnancy? no  Is the patient on methotrexate? no  Current Complaints / other details:  83 year old male. Married.

## 2017-05-28 ENCOUNTER — Other Ambulatory Visit: Payer: Self-pay

## 2017-05-28 ENCOUNTER — Ambulatory Visit
Admission: RE | Admit: 2017-05-28 | Discharge: 2017-05-28 | Disposition: A | Discharge status: Home / Self Care | Payer: Medicare Other | Source: Ambulatory Visit | Attending: Radiation Oncology | Admitting: Radiation Oncology

## 2017-05-28 ENCOUNTER — Encounter: Payer: Self-pay | Admitting: Radiation Oncology

## 2017-05-28 VITALS — BP 155/74 | HR 61 | Temp 98.6°F | Resp 18 | Wt 189.4 lb

## 2017-05-28 DIAGNOSIS — Z8711 Personal history of peptic ulcer disease: Secondary | ICD-10-CM | POA: Insufficient documentation

## 2017-05-28 DIAGNOSIS — Z7982 Long term (current) use of aspirin: Secondary | ICD-10-CM | POA: Insufficient documentation

## 2017-05-28 DIAGNOSIS — C61 Malignant neoplasm of prostate: Secondary | ICD-10-CM

## 2017-05-28 DIAGNOSIS — E785 Hyperlipidemia, unspecified: Secondary | ICD-10-CM | POA: Insufficient documentation

## 2017-05-28 DIAGNOSIS — F418 Other specified anxiety disorders: Secondary | ICD-10-CM | POA: Diagnosis not present

## 2017-05-28 DIAGNOSIS — K219 Gastro-esophageal reflux disease without esophagitis: Secondary | ICD-10-CM | POA: Insufficient documentation

## 2017-05-28 DIAGNOSIS — I10 Essential (primary) hypertension: Secondary | ICD-10-CM | POA: Diagnosis not present

## 2017-05-28 DIAGNOSIS — I2510 Atherosclerotic heart disease of native coronary artery without angina pectoris: Secondary | ICD-10-CM | POA: Insufficient documentation

## 2017-05-28 DIAGNOSIS — F419 Anxiety disorder, unspecified: Secondary | ICD-10-CM | POA: Diagnosis not present

## 2017-05-28 DIAGNOSIS — Z87442 Personal history of urinary calculi: Secondary | ICD-10-CM | POA: Insufficient documentation

## 2017-05-28 DIAGNOSIS — Z79899 Other long term (current) drug therapy: Secondary | ICD-10-CM | POA: Insufficient documentation

## 2017-05-28 HISTORY — DX: Malignant neoplasm of prostate: C61

## 2017-05-28 NOTE — Progress Notes (Signed)
See progress note under physician encounter. 

## 2017-05-28 NOTE — Progress Notes (Signed)
Radiation Oncology         (336) [phone] ________________________________  Initial outpatient Consultation  Name: Tristan Kennedy MRN: [id number]  Date: 05/28/2017  DOB: Mar 17, 1934  UU:[id number], Audree Camel, MD  Kathie Rhodes, MD   REFERRING PHYSICIAN: Kathie Rhodes, MD  DIAGNOSIS: 83 y.o. gentleman with stage T2a adenocarcinoma of the prostate with a Gleason's score of 4+4 and a PSA of 2.4.    ICD-10-CM   1. Malignant neoplasm of prostate ([place]) C61     HISTORY OF PRESENT ILLNESS::Tristan Kennedy is a 83 y.o. gentleman.  He was noted to have a prostate nodule on DRE at the time of recent colonoscopy with his gastroenterologist, despite a PSA of 2.4 in 02/2017.  Accordingly, he was referred for evaluation in urology by Dr. Karsten Ro on 03/24/17 who confirmed an 8 mm right mid gland nodule. The patient proceeded to transrectal ultrasound with 12 biopsies of the prostate on 04/08/2017.  The prostate volume measured 46 cc.  Out of 12 core biopsies,3 were positive.  The maximum Gleason score was 4+4, and this was seen in right mid and right mid lateral.  Additionally, there was Gleason 3+3 disease in the right apex lateral. The patient's PSA has always been normal in the past, by patient report.  He had a CT of the pelvis on 04/29/17 for disease staging and this revealed no evidence of metastatic diease in the pelvis. He also had a Bone scan on 04/29/2017 revealing bilateral lower lumbar spine uptake attributed to facet arthropathy and punctate focus of mildly increased uptake near the inferomedial right orbit of uncertain etiology- felt most likely degenerative.   The patient reviewed the biopsy results with his urologist and he has kindly been referred today for discussion of potential radiation treatment options.  He was started on Lupron for ADT on 05/01/17 and is tolerating this well.      PREVIOUS RADIATION THERAPY: No  PAST MEDICAL HISTORY:  has a past medical history of Anxiety, CAD (coronary  artery disease), Cardiomyopathy ([place]) (12/12/2016), Depression, Essential hypertension (08/15/2014), Frequent PVCs (08/15/2014), GERD (gastroesophageal reflux disease), HLD (hyperlipidemia), HTN (hypertension), Hyperlipidemia (08/15/2014), Kidney stone, Mild CAD (08/15/2014), Paroxysmal VT ([place]) (12/12/2016), Prostate cancer ([place]), and PUD (peptic ulcer disease).    PAST SURGICAL HISTORY: Past Surgical History:  Procedure Laterality Date  . CARDIAC CATHETERIZATION    . KIDNEY STONE SURGERY  2000  . PROSTATE BIOPSY      FAMILY HISTORY: family history includes Cancer in his paternal uncle; Diabetes in his unknown relative; Hypertension in his unknown relative; Stroke in his unknown relative.  SOCIAL HISTORY:  reports that he has never smoked. He has never used smokeless tobacco. He reports that he does not drink alcohol or use drugs.  ALLERGIES: Patient has no known allergies.  MEDICATIONS:  Current Outpatient Medications  Medication Sig Dispense Refill  . acebutolol (SECTRAL) 200 MG capsule Take 1 capsule (200 mg total) by mouth daily. He takes it 5 days a week 90 capsule 2  . aspirin 81 MG tablet Take 81 mg by mouth daily.      Marland Kitchen Bioflavonoid Products (ESTER-C) 500-50 MG TABS Take by mouth daily.      . busPIRone (BUSPAR) 15 MG tablet TAKE 1/2 TABLET TWICE DAILY    . Calcium Carbonate-Vit D-Min (CALCIUM 1200 PO) Take by mouth. Calcium 1200 slow release    . lisinopril (PRINIVIL,ZESTRIL) 10 MG tablet Take 1 tablet (10 mg total) by mouth daily. 90 tablet 1  . magnesium oxide (  MAG-OX) 400 MG tablet Take 800 mg by mouth daily.      . methylcellulose (CITRUCEL) oral powder Take by mouth.    . Multiple Vitamins-Minerals (MULTIVITAMIN WITH MINERALS) tablet Take by mouth.    . ranitidine (ZANTAC) 300 MG capsule Take 300 mg by mouth every evening.      . sertraline (ZOLOFT) 50 MG tablet Take 50 mg by mouth 2 (two) times daily.      . simvastatin (ZOCOR) 40 MG tablet Take 1 tablet (40 mg total) by mouth  at bedtime. 30 tablet 11  . traMADol (ULTRAM) 50 MG tablet TAKE ONE TABLET BY MOUTH EVERY TWELVE HOURS AS NEEDED  0   No current facility-administered medications for this encounter.     REVIEW OF SYSTEMS:  On review of systems, the patient reports that he is doing well overall. He denies any chest pain, shortness of breath, cough, fevers, chills, night sweats, unintended weight changes. He denies any bowel disturbances, and denies abdominal pain, nausea or vomiting. He denies any new musculoskeletal or joint aches or pains.The patient completed an IPSS and IIEF questionnaire.  His IPSS score was 2 indicating mild urinary outflow obstructive symptoms.  He indicated that his erectile function is mild to moderate ED with a score of 12.   PHYSICAL EXAM:    weight is 189 lb 6.4 oz (85.9 kg). His oral temperature is 98.6 F (37 C). His blood pressure is 155/74 (abnormal) and his pulse is 61. His respiration is 18 and oxygen saturation is 99%.   In general this is a well appearing caucasian male in no acute distress. He is alert and oriented x4 and appropriate throughout the examination. HEENT reveals that the patient is normocephalic, atraumatic. EOMs are intact. PERRLA. Skin is intact without any evidence of gross lesions. Cardiovascular exam reveals a regular rate and rhythm, no clicks rubs or murmurs are auscultated. Chest is clear to auscultation bilaterally. Lymphatic assessment is performed and does not reveal any adenopathy in the cervical, supraclavicular, axillary, or inguinal chains. Abdomen has active bowel sounds in all quadrants and is intact. The abdomen is soft, non tender, non distended. Lower extremities are negative for pretibial pitting edema, deep calf tenderness, cyanosis or clubbing.  KPS = 100  100 - Normal; no complaints; no evidence of disease. 90   - Able to carry on normal activity; minor signs or symptoms of disease. 80   - Normal activity with effort; some signs or symptoms  of disease. 58   - Cares for self; unable to carry on normal activity or to do active work. 60   - Requires occasional assistance, but is able to care for most of his personal needs. 50   - Requires considerable assistance and frequent medical care. 50   - Disabled; requires special care and assistance. 21   - Severely disabled; hospital admission is indicated although death not imminent. 67   - Very sick; hospital admission necessary; active supportive treatment necessary. 10   - Moribund; fatal processes progressing rapidly. 0     - Dead  Karnofsky DA, Abelmann Benson, Craver LS and Burchenal [facility] [phone]) The use of the nitrogen mustards in the palliative treatment of carcinoma: with particular reference to bronchogenic carcinoma Cancer 1 634-56   LABORATORY DATA:  No results found for: WBC, HGB, HCT, MCV, PLT Lab Results  Component Value Date   NA 144 12/16/2016   K 4.5 12/16/2016   CL 103 12/16/2016   CO2 25 12/16/2016  Lab Results  Component Value Date   ALT 16 12/16/2016   AST 19 12/16/2016   ALKPHOS 53 12/16/2016   BILITOT 0.7 12/16/2016     RADIOGRAPHY: Nm Bone Scan Whole Body  Result Date: 04/29/2017 CLINICAL DATA:  Prostate cancer. EXAM: NUCLEAR MEDICINE WHOLE BODY BONE SCAN TECHNIQUE: Whole body anterior and posterior images were obtained approximately 3 hours after intravenous injection of radiopharmaceutical. RADIOPHARMACEUTICALS:  21.3 mCi Technetium-25m MDP IV COMPARISON:  Lumbar spine MRI 10/24/2015. Chest radiograph 07/30/2010. CT pelvis 04/29/2017. FINDINGS: There is radiotracer uptake by both kidneys with excreted tracer in the bladder. There is mildly ectopic low positioning of the left kidney in the abdomen. Increased uptake bilaterally in the lower lumbar spine is likely related to severe facet arthrosis at L4-5. There is a small focus of abnormal uptake near the left costovertebral junction at T8. A punctate focus of increased tracer is also noted near the  inferomedial aspect of the right orbit. Uptake elsewhere throughout the skeleton is symmetric. IMPRESSION: 1. Focal uptake near the left T8 costovertebral junction. While a metastasis is possible, the location and lack of definite osseous metastases elsewhere suggest that this may instead be degenerative in etiology. 2. Bilateral lower lumbar spine uptake attributed to facet arthropathy. 3. Punctate focus of mildly increased uptake near the inferomedial right orbit of uncertain etiology. Electronically Signed   By: Logan Bores M.D.   On: 04/29/2017 16:07      IMPRESSION/PLAN: Tristan Kennedy is a 83 y.o. gentleman with  stage T2a adenocarcinoma of the prostate with a Gleason's score of 4+4 and a PSA of 2.4.  His T-Stage, Gleason's Score, and PSA put him into the high risk group.  Accordingly he is eligible for a variety of potential treatment options including LT-ADT in combination with either 8 weeks of daily external radiation therapy or 5 weeks of EBRT combined with brachytherapy as a boost. He has started hormone therapy (LUPRON) as of 05/01/2017, next injection is due in 6 months. We discussed radiation treatment in the management of prostate cancer with regard to the logistics and delivery of external beam radiation treatment as well as the logistics and delivery of prostate brachytherapy.  We compared and contrasted each of these approaches and also compared these against prostatectomy.  The patient expressed interest in LT-ADT combined with 5 weeks of daily externa beam radiotherapy followed by prostate brachytherapy approximately 3 weeks after daily radiation treatment is complete.  We recommend beginning radiotherapy after the patient has been on hormone therapy for approximately 2 months.  At the conclusion of our conversation, the patient elects to proceed with LT-ADT in combination with 5 weeks of daily external beam radiation treatment combined with prostate brachytherapy as a boost.  We will share our  findings with Dr. Karsten Ro and move forward with scheduling the procedure in the near future. We enjoyed meeting with him today, and will look forward to participating in the care of this very nice gentleman.  We spent 60 minutes minutes face to face with the patient and more than 50% of that time was spent in counseling and/or coordination of care.    Nicholos Johns, PA-C    Tyler Pita, MD  [facility] Direct Dial: [phone]  Fax: [phone] [email or web address]  Skype  LinkedIn    Page Me   This document serves as a record of services personally performed by Tyler Pita, MD and Ashlyn Bruning PA-C. It was created on their behalf by Delton Coombes, a  trained medical scribe. The creation of this record is based on the scribe's personal observations and the provider's statements to them.

## 2017-05-29 ENCOUNTER — Telehealth: Payer: Self-pay | Admitting: Medical Oncology

## 2017-05-29 DIAGNOSIS — C61 Malignant neoplasm of prostate: Secondary | ICD-10-CM | POA: Insufficient documentation

## 2017-05-29 NOTE — Telephone Encounter (Signed)
Called patient and his wife states he is not feeling well and is sleeping. She accompanied him yesterday to the radiation consult with Dr. Tammi Klippel. I introduced myself as the prostate nurse navigator and my role. I was unable to meet them yesterday when they were here. She states they were very impressed with all the staff and the information they received. He received his Lupron 05/01/17 and has chosen  5 weeks of radiation with seed boost. I gave her my office number and asked her to call with questions or concerns. She voiced understanding.

## 2017-06-02 ENCOUNTER — Telehealth: Payer: Self-pay | Admitting: *Deleted

## 2017-06-02 NOTE — Telephone Encounter (Signed)
CALLED PATIENT TO ASK QUESTIONS, LVM FOR A RETURN CALL 

## 2017-06-06 ENCOUNTER — Other Ambulatory Visit: Payer: Self-pay | Admitting: Urology

## 2017-06-06 ENCOUNTER — Telehealth: Payer: Self-pay | Admitting: *Deleted

## 2017-06-06 NOTE — Telephone Encounter (Signed)
CALLED PATIENT TO INFORM OF PRE-SEED SCAN FOR 06-26-17- ARRIVAL TIME - 10:45 AM @ [place], SPOKE WITH PATIENT AND HE IS AWARE OF THESE APPTS.

## 2017-06-10 ENCOUNTER — Telehealth: Payer: Self-pay | Admitting: *Deleted

## 2017-06-10 NOTE — Telephone Encounter (Signed)
Called patient to inform of implant date, spoke with patient and his wife and they are aware of this date

## 2017-06-25 ENCOUNTER — Telehealth: Payer: Self-pay | Admitting: *Deleted

## 2017-06-25 NOTE — Telephone Encounter (Signed)
CALLED PATIENT TO REMIND OF PRE-SEED PLANNING FOR 06-26-17, AND HIS CHEST X-RAY AND EKG FOR 06-26-17 - ARRIVAL TIME - 9:45 AM @ [facility]MITTING, SPOKE WITH PATIENT AND HE IS AWARE OF THESE APPTS.

## 2017-06-25 NOTE — Progress Notes (Signed)
  Radiation Oncology         (336) [phone] ________________________________  Name: Tristan Kennedy MRN: [id number]  Date: 06/26/2017  DOB: 1934/07/18  SIMULATION AND TREATMENT PLANNING NOTE PUBIC ARCH STUDY  TI:[id number], Audree Camel, MD  Kathie Rhodes, MD  DIAGNOSIS: 83 y.o. gentleman with stage T2a adenocarcinoma of the prostate with a Gleason score of 4+4 and PSA of 2.4.    ICD-10-CM   1. Malignant neoplasm of prostate ([place]) C61     COMPLEX SIMULATION:  The patient presented today for evaluation for possible prostate seed implant. He was brought to the radiation planning suite and placed supine on the CT couch. A 3-dimensional image study set was obtained in upload to the planning computer. There, on each axial slice, I contoured the prostate gland. Then, using three-dimensional radiation planning tools I reconstructed the prostate in view of the structures from the transperineal needle pathway to assess for possible pubic arch interference. In doing so, I did not appreciate any pubic arch interference. Also, the patient's prostate volume was estimated based on the drawn structure. The volume was 27 cc.  Given the pubic arch appearance and prostate volume, patient remains a good candidate to proceed with prostate seed implant. Today, he freely provided informed written consent to proceed.    PLAN: The patient will undergo prostate seed implant.   ________________________________  Sheral Apley. Tammi Klippel, M.D.  This document serves as a record of services personally performed by Tyler Pita MD. It was created on his behalf by Delton Coombes, a trained medical scribe. The creation of this record is based on the scribe's personal observations and the provider's statements to them.

## 2017-06-26 ENCOUNTER — Ambulatory Visit: Payer: Medicare Other | Admitting: Radiation Oncology

## 2017-06-26 ENCOUNTER — Encounter (HOSPITAL_COMMUNITY)
Admission: RE | Admit: 2017-06-26 | Discharge: 2017-06-26 | Disposition: A | Discharge status: Home / Self Care | Payer: Medicare Other | Source: Ambulatory Visit | Attending: Urology | Admitting: Urology

## 2017-06-26 ENCOUNTER — Ambulatory Visit
Admission: RE | Admit: 2017-06-26 | Discharge: 2017-06-26 | Disposition: A | Discharge status: Home / Self Care | Payer: Medicare Other | Source: Ambulatory Visit | Attending: Radiation Oncology | Admitting: Radiation Oncology

## 2017-06-26 ENCOUNTER — Encounter: Payer: Self-pay | Admitting: Medical Oncology

## 2017-06-26 ENCOUNTER — Other Ambulatory Visit: Payer: Self-pay

## 2017-06-26 ENCOUNTER — Ambulatory Visit (HOSPITAL_COMMUNITY)
Admission: RE | Admit: 2017-06-26 | Discharge: 2017-06-26 | Disposition: A | Discharge status: Home / Self Care | Payer: Medicare Other | Source: Ambulatory Visit | Attending: Urology | Admitting: Urology

## 2017-06-26 DIAGNOSIS — C61 Malignant neoplasm of prostate: Secondary | ICD-10-CM | POA: Insufficient documentation

## 2017-06-26 NOTE — Progress Notes (Signed)
Met Mr. Tristan Kennedy and his wife during CT simulation. He is scheduled for seed implant 08/11/17. He and his wife had lots of questions regarding procedure and recovery. The seed implant will be followed with 5 weeks of radiation. I asked them to call with questions or concerns. They voiced understanding.

## 2017-06-30 ENCOUNTER — Other Ambulatory Visit: Payer: Self-pay

## 2017-06-30 MED ORDER — LISINOPRIL 10 MG PO TABS: 10.0000 mg | ORAL_TABLET | Freq: Every day | ORAL | 1 refills | Status: DC

## 2017-07-02 NOTE — Progress Notes (Signed)
Pt had cxr/ ekg done 06-26-2017 for prostate seed implants.  Reviewed with dr Dossie Arbour, pt's confirmed ekg  dated 06-26-2017, noted widened qrs interval and compared to last ekg dated 12-16-2016 , done at pt's cardiologist office, dr Ambrose Pancoast mda stated ok to proceed.

## 2017-07-22 ENCOUNTER — Telehealth: Payer: Self-pay | Admitting: Cardiology

## 2017-07-22 NOTE — Telephone Encounter (Signed)
Patient reports his pulse was 45 yesterday, pulse has been better today around the mid 50's. Blood pressure has been 120's over 70's. Patient felt that he was fine and did not wish to be seen asap. Patient did request to be seen sooner than one year; Lattie Haw has added the patient to a recall list for the month of August. Patient will have his wife to call the office to clarify his cardiac medication.

## 2017-07-22 NOTE — Telephone Encounter (Signed)
Pt has been diagnosed with prostate cancer and has been having high BP/wants to be "worked in" with [employer]

## 2017-07-25 NOTE — Telephone Encounter (Signed)
Patient has been scheduled to come in for a sooner appointment. Per the patient he feels that he does not need to come in sooner.

## 2017-07-31 ENCOUNTER — Telehealth: Payer: Self-pay | Admitting: Medical Oncology

## 2017-07-31 NOTE — Telephone Encounter (Signed)
Mrs. Saadeh called with questions about husband's seed implant and appointments.They received a letter with appointment for lab work on 7/22 but there is not a time. Questions about procedure answered and I will call her back after confirming appointment times with Enid Derry. She voiced understanding.

## 2017-07-31 NOTE — Telephone Encounter (Signed)
Spoke with wife-Clara to inform her that lab appointment for 7/22 is at 1:00pm. They need to arrive at 12:45 pm at [facility] admissions. I informed her Enid Derry with Dr. Tammi Klippel will call her tomorrow with reminder. She voiced understanding.

## 2017-08-01 ENCOUNTER — Telehealth: Payer: Self-pay | Admitting: *Deleted

## 2017-08-01 NOTE — Telephone Encounter (Signed)
CALLED PATIENT TO REMIND OF LABS FOR 08-04-17, NO ANSWER.

## 2017-08-04 ENCOUNTER — Encounter (HOSPITAL_COMMUNITY)
Admission: RE | Admit: 2017-08-04 | Discharge: 2017-08-04 | Disposition: A | Discharge status: Home / Self Care | Payer: Medicare Other | Source: Ambulatory Visit | Attending: Urology | Admitting: Urology

## 2017-08-04 DIAGNOSIS — Z01812 Encounter for preprocedural laboratory examination: Secondary | ICD-10-CM | POA: Insufficient documentation

## 2017-08-04 LAB — CBC
HCT: 39.3 % (ref 39.0–52.0)
Hemoglobin: 13.2 g/dL (ref 13.0–17.0)
MCH: 29.7 pg (ref 26.0–34.0)
MCHC: 33.6 g/dL (ref 30.0–36.0)
MCV: 88.3 fL (ref 78.0–100.0)
Platelets: 112 10*3/uL — ABNORMAL LOW (ref 150–400)
RBC: 4.45 MIL/uL (ref 4.22–5.81)
RDW: 14.4 % (ref 11.5–15.5)
WBC: 5 10*3/uL (ref 4.0–10.5)

## 2017-08-04 LAB — COMPREHENSIVE METABOLIC PANEL
ALT: 20 U/L (ref 0–44)
AST: 31 U/L (ref 15–41)
Albumin: 3.9 g/dL (ref 3.5–5.0)
Alkaline Phosphatase: 50 U/L (ref 38–126)
Anion gap: 6 (ref 5–15)
BUN: 25 mg/dL — ABNORMAL HIGH (ref 8–23)
CO2: 27 mmol/L (ref 22–32)
Calcium: 9.2 mg/dL (ref 8.9–10.3)
Chloride: 112 mmol/L — ABNORMAL HIGH (ref 98–111)
Creatinine, Ser: 1.4 mg/dL — ABNORMAL HIGH (ref 0.61–1.24)
GFR calc Af Amer: 53 mL/min — ABNORMAL LOW (ref >60)
GFR calc non Af Amer: 46 mL/min — ABNORMAL LOW (ref >60)
Glucose, Bld: 96 mg/dL (ref 70–99)
Potassium: 5.1 mmol/L (ref 3.5–5.1)
Sodium: 145 mmol/L (ref 135–145)
Total Bilirubin: 0.4 mg/dL (ref 0.3–1.2)
Total Protein: 7 g/dL (ref 6.5–8.1)

## 2017-08-04 LAB — APTT: aPTT: 33 seconds (ref 24–36)

## 2017-08-04 LAB — PROTIME-INR
INR: 1.13
Prothrombin Time: 14.4 seconds (ref 11.4–15.2)

## 2017-08-05 NOTE — Progress Notes (Signed)
CBC result dated 08-04-2017 sent to dr Karsten Ro in epic.

## 2017-08-06 ENCOUNTER — Encounter (HOSPITAL_BASED_OUTPATIENT_CLINIC_OR_DEPARTMENT_OTHER): Payer: Self-pay

## 2017-08-06 ENCOUNTER — Other Ambulatory Visit: Payer: Self-pay

## 2017-08-06 NOTE — Progress Notes (Signed)
Fleet enema AM day of surgery, wife verbalized understanding

## 2017-08-06 NOTE — Progress Notes (Signed)
Spoke with: Clara (wife) NPO:  After Midnight, no gum, candy, or mints   Arrival time: 730AM Labs: EKG, CXR, CBC, CMP, PT, PTT in epic AM medications:  Acebutolol, Buspirone, Sertraline, Ativan, Tramadol if needed Pre op orders:  Yes  Ride home:  Estill Batten (wife) [phone] and Kenney Houseman (daughter) [phone]

## 2017-08-08 ENCOUNTER — Encounter (HOSPITAL_BASED_OUTPATIENT_CLINIC_OR_DEPARTMENT_OTHER): Payer: Self-pay | Admitting: *Deleted

## 2017-08-08 ENCOUNTER — Telehealth: Payer: Self-pay | Admitting: *Deleted

## 2017-08-08 NOTE — Telephone Encounter (Signed)
CALLED PATIENT TO REMIND OF PROCEDURE FOR 08-11-17, NO ANSWER WILL CALL LATER

## 2017-08-10 NOTE — H&P (Signed)
HPI: Tristan Kennedy is a 83 year-old male with prostate cancer who presents today for radioactive seed implantation.  His prostate cancer was diagnosed 04/08/2017. His PSA at his time of diagnosis was 2.4. His cancer was T2a, 4+4 = 8 from 2 cores at the mid gland on the right and 1 core of Gleason 4+3 = 7 from the apex on the right.  Prostate cancer: He was found to have a nodule in the midportion of the prostate on the right-hand side. PSA in 2/19 - 2.4.  TRUS/BX 04/08/17: Prostate volume - 46 cc  Pathology: Adenocarcinoma Gleason 4+4 = 8 from 2 cores at the mid gland on the right and 1 core of Gleason 4+3 = 7 from the apex on the right.  Stage: T2a  Metastatic workup: CT scan of the pelvis and bone scan - negative.   05/01/17: He has returned today having undergone evaluation for metastatic disease with a bone scan and CT scan. He returned today with his family to go over the results. No new complaints are noted today.     ALLERGIES: No Allergies    MEDICATIONS: Aspirin  Diazepam 10 mg tablet 1 tablet PO As Directed Take approximately 45 minutes prior to scheduled procedure on an empty stomach.  Lisinopril 10 mg tablet  Simvastatin 40 mg tablet  Acebutolol Hcl 200 mg capsule  Buspirone Hcl  Centrum Silver  Ester C  Levaquin 750 mg tablet Take the morning of your prostate biopsy.  Magnesium  Sertraline Hcl 50 mg tablet  Tizanidine Hcl 4 mg capsule  Tramadol Hcl 50 mg tablet  Vitamin D3     GU PSH: Prostate Needle Biopsy - 04/08/2017    NON-GU PSH: Surgical Pathology, Gross And Microscopic Examination For Prostate Needle - 04/08/2017    GU PMH: Prostate Cancer, He is going to be scheduled for a CT scan of the pelvis and bone scan and then will return to go over the results. - 04/15/2017 Prostate Cyst, I noted a prominent midline cyst at the base of the prostate on his transrectal ultrasound consistent with an ejaculatory duct cyst. Since fertility is not an issue this is of no  clinical significance. - 04/15/2017 BPH w/o LUTS, He does have BPH but does not have any significant obstructive voiding symptoms. - 03/24/2017 Prostate nodule w/o LUTS, He has a distinct nodule felt in the mid apical region on the right-hand side that is quite hard. While it may be a benign finding with a PSA of 2.4 we discussed the fact that very aggressive cancers sometimes do not produce significant amounts of PSA and therefore I have recommended further evaluation with a prostate biopsy. - 03/24/2017     NON-GU PMH: Acute gastric ulcer with hemorrhage Anxiety Arthritis Depression Hypercholesterolemia Hypertension    FAMILY HISTORY: Diabetes - Sister nephrolithiasis - Runs in Family Pacemaker Placement - Mother stroke - Father   SOCIAL HISTORY: Marital Status: Married Preferred Language: English; Race: White Current Smoking Status: Patient has never smoked.   Tobacco Use Assessment Completed: Used Tobacco in last 30 days? Has never drank.  Drinks 2 caffeinated drinks per day.    REVIEW OF SYSTEMS:    GU Review Male:   Patient denies frequent urination, hard to postpone urination, burning/ pain with urination, get up at night to urinate, leakage of urine, stream starts and stops, trouble starting your stream, have to strain to urinate , erection problems, and penile pain.  Gastrointestinal (Upper):   Patient denies indigestion/ heartburn, nausea, and  vomiting.  Gastrointestinal (Lower):   Patient denies diarrhea and constipation.  Constitutional:   Patient denies fever, night sweats, weight loss, and fatigue.  Skin:   Patient denies skin rash/ lesion and itching.  Eyes:   Patient denies blurred vision and double vision.  Ears/ Nose/ Throat:   Patient denies sore throat and sinus problems.  Hematologic/Lymphatic:   Patient denies swollen glands and easy bruising.  Cardiovascular:   Patient denies leg swelling and chest pains.  Respiratory:   Patient denies cough and shortness of  breath.  Endocrine:   Patient denies excessive thirst.  Musculoskeletal:   Patient denies back pain and joint pain.  Neurological:   Patient denies headaches and dizziness.  Psychologic:   Patient denies depression and anxiety.   VITAL SIGNS:    Weight 188 lb / 85.28 kg  Height 69 in / 175.26 cm  BP 124/65 mmHg  Pulse 69 /min  BMI 27.8 kg/m   GU PHYSICAL EXAMINATION:    Anus and Perineum: No hemorrhoids. No anal stenosis. No rectal fissure, no anal fissure. No edema, no dimple, no perineal tenderness, no anal tenderness.  Scrotum: No lesions. No edema. No cysts. No warts.  Epididymides: Right: no spermatocele, no masses, no cysts, no tenderness, no induration, no enlargement. Left: no spermatocele, no masses, no cysts, no tenderness, no induration, no enlargement.  Testes: No tenderness, no swelling, no enlargement left testes. No tenderness, no swelling, no enlargement right testes. Normal location left testes. Normal location right testes. No mass, no cyst, no varicocele, no hydrocele left testes. No mass, no cyst, no varicocele, no hydrocele right testes.  Urethral Meatus: Normal size. No lesion, no wart, no discharge, no polyp. Normal location.  Penis: Penis uncircumcised. No foreskin warts, no cracks. No dorsal peyronie's plaques, no left corporal peyronie's plaques, no right corporal peyronie's plaques, no scarring, no shaft warts. No balanitis, no meatal stenosis.   Prostate: Prostate 2 1/2+ size. Right mid gland 8 mm prostate nodule. Left lobe normal consistency, right lobe normal consistency. Symmetrical lobes. Left lobe no tenderness, right lobe no tenderness.   Seminal Vesicles: Nonpalpable.  Sphincter Tone: Normal sphincter. No rectal tenderness. No rectal mass.    MULTI-SYSTEM PHYSICAL EXAMINATION:    Constitutional: Well-nourished. No physical deformities. Normally developed. Good grooming.  Neck: Neck symmetrical, not swollen. Normal tracheal position.  Respiratory: No  labored breathing, no use of accessory muscles. Normal breath sounds.  Cardiovascular: Regular rate and rhythm. No murmur, no gallop. Normal temperature, normal extremity pulses, no swelling, no varicosities.  Lymphatic: No enlargement of neck, axillae, groin.  Skin: No paleness, no jaundice, no cyanosis. No lesion, no ulcer, no rash.  Neurologic / Psychiatric: Oriented to time, oriented to place, oriented to person. No depression, no anxiety, no agitation.  Gastrointestinal: No mass, no tenderness, no rigidity, non obese abdomen.  Eyes: Normal conjunctivae. Normal eyelids.  Ears, Nose, Mouth, and Throat: Left ear no scars, no lesions, no masses. Right ear no scars, no lesions, no masses. Nose no scars, no lesions, no masses. Normal hearing. Normal lips.  Musculoskeletal: Normal gait and station of head and neck.    PAST DATA REVIEWED:  Source Of History:  Patient  X-Ray Review: C.T. Pelvis: Reviewed Films. Reviewed Report. Discussed With Patient. EXAM: CT PELVIS WITHOUT CONTRAST TECHNIQUE: Multidetector CT imaging of the pelvis was performed following the standard protocol without intravenous contrast. COMPARISON: None. FINDINGS: Urinary Tract: Normal bladder. Normal caliber distal ureters. Bowel: Marked sigmoid diverticulosis. No bowel dilatation or bowel  wall thickening in the pelvis. Vascular/Lymphatic: Atherosclerotic body aneurysmal visualized lower abdominal aorta. No pathologically enlarged pelvic lymph nodes. Reproductive: Mildly enlarged prostate. Prostate dimensions 4.2 x 3.6 x 4.9 cm (volume = 39 cm^3). Other: No pneumoperitoneum or focal fluid collection in the pelvis. Trace free fluid layering in the pelvis. Tiny fat containing umbilical hernia. Musculoskeletal: No aggressive appearing focal osseous lesions. Mild-to-moderate bilateral hip osteoarthritis. IMPRESSION: 1. No evidence of metastatic disease in the pelvis. 2. Mildly enlarged prostate. 3. Marked sigmoid diverticulosis. 4. Aortic  Atherosclerosis (ICD10-I70.0).  Bone Scan: Reviewed Report. Discussed With Patient. His bone scan revealed a single small focus of uptake at the costal vertebral angle on the left side at the T8 level. With no other areas of uptake within the skeleton it was felt by the radiologist that this likely was not evidence of metastatic disease.    PROCEDURES:          Urinalysis Dipstick Dipstick Cont'd  Color: Yellow Bilirubin: Neg  Appearance: Clear Ketones: Neg  Specific Gravity: 1.025 Blood: Neg  pH: 5.5 Protein: Trace  Glucose: Neg Urobilinogen: 0.2    Nitrites: Neg    Leukocyte Esterase: Neg    ASSESSMENT/PLAN:      ICD-10 Details  1 GU:   Prostate Cancer  The patient was counseled about the natural history of prostate cancer and the standard treatment options that are available for prostate cancer. It was explained to him how his age and life expectancy, clinical stage, Gleason score, and PSA affect his prognosis, the decision to proceed with additional staging studies, as well as how that information influences recommended treatment strategies. We discussed the roles for active surveillance, radiation therapy, surgical therapy, androgen deprivation, as well as ablative therapy options for the treatment of prostate cancer as appropriate to his individual cancer situation. We discussed the risks and benefits of these options with regard to their impact on cancer control and also in terms of potential adverse events, complications, and impact on quality of life particularly related to urinary, bowel, and sexual function. The patient was encouraged to ask questions throughout the discussion today and all questions were answered to his stated satisfaction. In addition, the patient was provided with and/or directed to appropriate resources and literature for further education about prostate cancer and treatment options.   With what appears to be organ confined disease and a prostate that measures  46 cc he would be a candidate for radioactive seeds. We have discussed neoadjuvant ADT. I went over the mechanism of action of Lupron as well as the potential side effects and long-term risks of the medication. He already takes vitamin-D and I recommended he begin 1200 mg of calcium a day. He is going to be given an injection of Lupron once authorization has been obtained from his insurance carrier and will remain on this after he completes radiation.   He does have a prostate that is of a size that he could potentially undergo radioactive seed implantation but does have some voiding symptoms. He will begin Lupron.

## 2017-08-10 NOTE — Progress Notes (Signed)
  Radiation Oncology         [phone] ________________________________  Name: FOYE DAMRON MRN: [id number]  Date: 08/11/2017  DOB: [birth year]       Prostate Seed Implant  XU:XYBFXOV, Audree Camel, MD  No ref. provider found  DIAGNOSIS: 83 y.o. gentleman with stage T2a adenocarcinoma of the prostate with a Gleason score of 4+4 and PSA of 2.4.    ICD-10-CM   1. Prostate cancer ([place]) C61 DG Chest 2 View    DG Chest 2 View    Discharge patient    PROCEDURE: Insertion of radioactive I-125 seeds into the prostate gland.  RADIATION DOSE: 110 Gy, boost therapy.  TECHNIQUE: Neizan Debruhl Roethler was brought to the operating room with the urologist. He was placed in the dorsolithotomy position. He was catheterized and a rectal tube was inserted. The perineum was shaved, prepped and draped. The ultrasound probe was then introduced into the rectum to see the prostate gland.  TREATMENT DEVICE: A needle grid was attached to the ultrasound probe stand and anchor needles were placed.  3D PLANNING: The prostate was imaged in 3D using a sagittal sweep of the prostate probe. These images were transferred to the planning computer. There, the prostate, urethra and rectum were defined on each axial reconstructed image. Then, the software created an optimized 3D plan and a few seed positions were adjusted. The quality of the plan was reviewed using [facility] information for the target and the following two organs at risk:  Urethra and Rectum.  Then the accepted plan was printed and handed off to the radiation therapist.  Under my supervision, the custom loading of the seeds and spacers was carried out and loaded into sealed vicryl sleeves.  These pre-loaded needles were then placed into the needle holder.Marland Kitchen  PROSTATE VOLUME STUDY:  Using transrectal ultrasound the volume of the prostate was verified to be 24.99 cc.  SPECIAL TREATMENT PROCEDURE/SUPERVISION AND HANDLING: The pre-loaded needles were then delivered under  sagittal guidance. A total of 26 needles were used to deposit 59 seeds in the prostate gland. The individual seed activity was 0.310 mCi.  SpaceOAR:  Yes  COMPLEX SIMULATION: At the end of the procedure, an anterior radiograph of the pelvis was obtained to document seed positioning and count. Cystoscopy was performed to check the urethra and bladder.  MICRODOSIMETRY: At the end of the procedure, the patient was emitting 0.108 mR/hr at 1 meter. Accordingly, he was considered safe for hospital discharge.  PLAN: The patient will return to the radiation oncology clinic for post implant CT dosimetry in three weeks.   ________________________________  Sheral Apley Tammi Klippel, M.D.

## 2017-08-11 ENCOUNTER — Ambulatory Visit (HOSPITAL_BASED_OUTPATIENT_CLINIC_OR_DEPARTMENT_OTHER): Payer: Medicare Other | Admitting: Anesthesiology

## 2017-08-11 ENCOUNTER — Encounter (HOSPITAL_BASED_OUTPATIENT_CLINIC_OR_DEPARTMENT_OTHER)
Admission: RE | Disposition: A | Discharge status: Home / Self Care | Payer: Self-pay | Source: Ambulatory Visit | Attending: Urology

## 2017-08-11 ENCOUNTER — Encounter (HOSPITAL_BASED_OUTPATIENT_CLINIC_OR_DEPARTMENT_OTHER): Payer: Self-pay

## 2017-08-11 ENCOUNTER — Ambulatory Visit (HOSPITAL_BASED_OUTPATIENT_CLINIC_OR_DEPARTMENT_OTHER)
Admission: RE | Admit: 2017-08-11 | Discharge: 2017-08-11 | Disposition: A | Discharge status: Home / Self Care | Payer: Medicare Other | Source: Ambulatory Visit | Attending: Urology | Admitting: Urology

## 2017-08-11 ENCOUNTER — Ambulatory Visit (HOSPITAL_COMMUNITY): Payer: Medicare Other

## 2017-08-11 DIAGNOSIS — F329 Major depressive disorder, single episode, unspecified: Secondary | ICD-10-CM | POA: Insufficient documentation

## 2017-08-11 DIAGNOSIS — K219 Gastro-esophageal reflux disease without esophagitis: Secondary | ICD-10-CM | POA: Diagnosis not present

## 2017-08-11 DIAGNOSIS — C61 Malignant neoplasm of prostate: Secondary | ICD-10-CM | POA: Diagnosis present

## 2017-08-11 DIAGNOSIS — Z8711 Personal history of peptic ulcer disease: Secondary | ICD-10-CM | POA: Insufficient documentation

## 2017-08-11 DIAGNOSIS — I739 Peripheral vascular disease, unspecified: Secondary | ICD-10-CM | POA: Diagnosis not present

## 2017-08-11 DIAGNOSIS — Z79899 Other long term (current) drug therapy: Secondary | ICD-10-CM | POA: Insufficient documentation

## 2017-08-11 DIAGNOSIS — I10 Essential (primary) hypertension: Secondary | ICD-10-CM | POA: Insufficient documentation

## 2017-08-11 DIAGNOSIS — F419 Anxiety disorder, unspecified: Secondary | ICD-10-CM | POA: Insufficient documentation

## 2017-08-11 DIAGNOSIS — I2510 Atherosclerotic heart disease of native coronary artery without angina pectoris: Secondary | ICD-10-CM | POA: Diagnosis not present

## 2017-08-11 DIAGNOSIS — Z7982 Long term (current) use of aspirin: Secondary | ICD-10-CM | POA: Diagnosis not present

## 2017-08-11 DIAGNOSIS — M1990 Unspecified osteoarthritis, unspecified site: Secondary | ICD-10-CM | POA: Diagnosis not present

## 2017-08-11 DIAGNOSIS — E7800 Pure hypercholesterolemia, unspecified: Secondary | ICD-10-CM | POA: Diagnosis not present

## 2017-08-11 HISTORY — PX: CYSTOSCOPY: SHX5120

## 2017-08-11 HISTORY — DX: Migraine, unspecified, not intractable, without status migrainosus: G43.909

## 2017-08-11 HISTORY — DX: Personal history of other diseases of the circulatory system: Z86.79

## 2017-08-11 HISTORY — PX: RADIOACTIVE SEED IMPLANT: SHX5150

## 2017-08-11 HISTORY — DX: Other fatigue: R53.83

## 2017-08-11 HISTORY — DX: Unspecified osteoarthritis, unspecified site: M19.90

## 2017-08-11 HISTORY — DX: Personal history of urinary calculi: Z87.442

## 2017-08-11 HISTORY — DX: Unspecified malignant neoplasm of skin, unspecified: C44.90

## 2017-08-11 HISTORY — DX: Diverticulosis of large intestine without perforation or abscess without bleeding: K57.30

## 2017-08-11 HISTORY — DX: Atherosclerosis of aorta: I70.0

## 2017-08-11 HISTORY — DX: Personal history of other specified conditions: Z87.898

## 2017-08-11 HISTORY — PX: SPACE OAR INSTILLATION: SHX6769

## 2017-08-11 HISTORY — DX: Spinal stenosis, lumbar region without neurogenic claudication: M48.061

## 2017-08-11 SURGERY — INSERTION, RADIATION SOURCE, PROSTATE
Anesthesia: General | Site: Rectum

## 2017-08-11 MED ORDER — IOHEXOL 300 MG/ML  SOLN
INTRAMUSCULAR | Status: DC | PRN
  Administered 2017-08-11: 7 mL via INTRAVENOUS

## 2017-08-11 MED ORDER — LIDOCAINE 2% (20 MG/ML) 5 ML SYRINGE
INTRAMUSCULAR | Status: AC
  Filled 2017-08-11: qty 5

## 2017-08-11 MED ORDER — PROPOFOL 10 MG/ML IV BOLUS
INTRAVENOUS | Status: AC
  Filled 2017-08-11: qty 20

## 2017-08-11 MED ORDER — DIPHENHYDRAMINE HCL 50 MG/ML IJ SOLN
INTRAMUSCULAR | Status: AC
Start: 2017-08-11 — End: ?
  Filled 2017-08-11: qty 1

## 2017-08-11 MED ORDER — LACTATED RINGERS IV SOLN
INTRAVENOUS | Status: DC
  Administered 2017-08-11 (×2): via INTRAVENOUS
  Filled 2017-08-11: qty 1000

## 2017-08-11 MED ORDER — PROMETHAZINE HCL 25 MG/ML IJ SOLN
6.2500 mg | INTRAMUSCULAR | Status: DC | PRN
  Filled 2017-08-11: qty 1

## 2017-08-11 MED ORDER — SODIUM CHLORIDE 0.9 % IJ SOLN
INTRAMUSCULAR | Status: DC | PRN
  Administered 2017-08-11: 10 mL

## 2017-08-11 MED ORDER — DIPHENHYDRAMINE HCL 50 MG/ML IJ SOLN
INTRAMUSCULAR | Status: DC | PRN
  Administered 2017-08-11: 6.25 mg via INTRAVENOUS

## 2017-08-11 MED ORDER — LIDOCAINE HCL (CARDIAC) PF 100 MG/5ML IV SOSY
PREFILLED_SYRINGE | INTRAVENOUS | Status: DC | PRN
  Administered 2017-08-11: 60 mg via INTRAVENOUS

## 2017-08-11 MED ORDER — CIPROFLOXACIN IN D5W 400 MG/200ML IV SOLN
INTRAVENOUS | Status: AC
  Filled 2017-08-11: qty 200

## 2017-08-11 MED ORDER — SODIUM CHLORIDE 0.9 % IR SOLN
Status: DC | PRN
  Administered 2017-08-11: 1000 mL via INTRAVESICAL

## 2017-08-11 MED ORDER — ARTIFICIAL TEARS OPHTHALMIC OINT
TOPICAL_OINTMENT | OPHTHALMIC | Status: AC
  Filled 2017-08-11: qty 3.5

## 2017-08-11 MED ORDER — CIPROFLOXACIN IN D5W 400 MG/200ML IV SOLN
400.0000 mg | INTRAVENOUS | Status: AC
  Administered 2017-08-11: 400 mg via INTRAVENOUS
  Filled 2017-08-11: qty 200

## 2017-08-11 MED ORDER — HYDROMORPHONE HCL 1 MG/ML IJ SOLN
0.2500 mg | INTRAMUSCULAR | Status: DC | PRN
  Filled 2017-08-11: qty 0.5

## 2017-08-11 MED ORDER — DEXAMETHASONE SODIUM PHOSPHATE 10 MG/ML IJ SOLN
INTRAMUSCULAR | Status: DC | PRN
  Administered 2017-08-11: 10 mg via INTRAVENOUS

## 2017-08-11 MED ORDER — ONDANSETRON HCL 4 MG/2ML IJ SOLN
INTRAMUSCULAR | Status: DC | PRN
  Administered 2017-08-11: 4 mg via INTRAVENOUS

## 2017-08-11 MED ORDER — EPHEDRINE SULFATE-NACL 50-0.9 MG/10ML-% IV SOSY
PREFILLED_SYRINGE | INTRAVENOUS | Status: DC | PRN
  Administered 2017-08-11 (×2): 10 mg via INTRAVENOUS
  Administered 2017-08-11: 5 mg via INTRAVENOUS
  Administered 2017-08-11: 10 mg via INTRAVENOUS

## 2017-08-11 MED ORDER — ONDANSETRON HCL 4 MG/2ML IJ SOLN
INTRAMUSCULAR | Status: AC
  Filled 2017-08-11: qty 2

## 2017-08-11 MED ORDER — FENTANYL CITRATE (PF) 100 MCG/2ML IJ SOLN
INTRAMUSCULAR | Status: DC | PRN
  Administered 2017-08-11: 25 ug via INTRAVENOUS
  Administered 2017-08-11: 50 ug via INTRAVENOUS
  Administered 2017-08-11: 25 ug via INTRAVENOUS

## 2017-08-11 MED ORDER — DEXAMETHASONE SODIUM PHOSPHATE 10 MG/ML IJ SOLN
INTRAMUSCULAR | Status: AC
  Filled 2017-08-11: qty 1

## 2017-08-11 MED ORDER — PROPOFOL 10 MG/ML IV BOLUS
INTRAVENOUS | Status: DC | PRN
  Administered 2017-08-11: 120 mg via INTRAVENOUS
  Administered 2017-08-11: 20 mg via INTRAVENOUS
  Administered 2017-08-11: 50 mg via INTRAVENOUS

## 2017-08-11 MED ORDER — HYDROCODONE-ACETAMINOPHEN 10-325 MG PO TABS: 1.0000 | ORAL_TABLET | ORAL | 0 refills | Status: DC | PRN

## 2017-08-11 MED ORDER — EPHEDRINE 5 MG/ML INJ
INTRAVENOUS | Status: AC
  Filled 2017-08-11: qty 10

## 2017-08-11 MED ORDER — CIPROFLOXACIN HCL 500 MG PO TABS: 500.0000 mg | ORAL_TABLET | Freq: Two times a day (BID) | ORAL | 0 refills | Status: DC

## 2017-08-11 MED ORDER — FENTANYL CITRATE (PF) 100 MCG/2ML IJ SOLN
INTRAMUSCULAR | Status: AC
  Filled 2017-08-11: qty 2

## 2017-08-11 MED ORDER — FLEET ENEMA 7-19 GM/118ML RE ENEM
1.0000 | ENEMA | Freq: Once | RECTAL | Status: DC
  Filled 2017-08-11: qty 1

## 2017-08-11 SURGICAL SUPPLY — 37 items
BAG URINE DRAINAGE (UROLOGICAL SUPPLIES) ×4 IMPLANT
BLADE CLIPPER SURG (BLADE) ×4 IMPLANT
CATH FOLEY 2WAY SLVR  5CC 16FR (CATHETERS) ×1
CATH FOLEY 2WAY SLVR 5CC 16FR (CATHETERS) ×3 IMPLANT
CATH ROBINSON RED A/P 16FR (CATHETERS) IMPLANT
CATH ROBINSON RED A/P 20FR (CATHETERS) ×4 IMPLANT
CLOTH BEACON ORANGE TIMEOUT ST (SAFETY) ×4 IMPLANT
CONT SPECI 4OZ STER CLIK (MISCELLANEOUS) ×8 IMPLANT
COVER BACK TABLE 60X90IN (DRAPES) ×4 IMPLANT
COVER MAYO STAND STRL (DRAPES) ×4 IMPLANT
DRSG TEGADERM 4X4.75 (GAUZE/BANDAGES/DRESSINGS) ×4 IMPLANT
DRSG TEGADERM 8X12 (GAUZE/BANDAGES/DRESSINGS) ×8 IMPLANT
GAUZE SPONGE 4X4 12PLY STRL (GAUZE/BANDAGES/DRESSINGS) ×4 IMPLANT
GLOVE BIO SURGEON STRL SZ 6 (GLOVE) IMPLANT
GLOVE BIO SURGEON STRL SZ7 (GLOVE) ×8 IMPLANT
GLOVE BIO SURGEON STRL SZ8 (GLOVE) ×4 IMPLANT
GLOVE BIOGEL PI IND STRL 6 (GLOVE) IMPLANT
GLOVE BIOGEL PI IND STRL 8 (GLOVE) IMPLANT
GLOVE BIOGEL PI INDICATOR 6 (GLOVE)
GLOVE BIOGEL PI INDICATOR 8 (GLOVE)
GLOVE ECLIPSE 8.0 STRL XLNG CF (GLOVE) ×4 IMPLANT
GLOVE INDICATOR 7.0 STRL GRN (GLOVE) IMPLANT
GOWN STRL REUS W/TWL XL LVL3 (GOWN DISPOSABLE) ×4 IMPLANT
HOLDER FOLEY CATH W/STRAP (MISCELLANEOUS) IMPLANT
I-SEED AGX100 ×236 IMPLANT
IMPL SPACEOAR SYSTEM 10ML (MISCELLANEOUS) ×3 IMPLANT
IMPLANT SPACEOAR SYSTEM 10ML (MISCELLANEOUS) ×4
IV NS 1000ML (IV SOLUTION) ×2
IV NS 1000ML BAXH (IV SOLUTION) ×3 IMPLANT
KIT TURNOVER CYSTO (KITS) ×4 IMPLANT
MARKER SKIN DUAL TIP RULER LAB (MISCELLANEOUS) ×4 IMPLANT
PACK CYSTO (CUSTOM PROCEDURE TRAY) ×4 IMPLANT
SURGILUBE 2OZ TUBE FLIPTOP (MISCELLANEOUS) ×4 IMPLANT
SYR 10ML LL (SYRINGE) ×4 IMPLANT
TOWEL OR 17X24 6PK STRL BLUE (TOWEL DISPOSABLE) ×8 IMPLANT
UNDERPAD 30X30 (UNDERPADS AND DIAPERS) ×8 IMPLANT
WATER STERILE IRR 500ML POUR (IV SOLUTION) ×4 IMPLANT

## 2017-08-11 NOTE — Transfer of Care (Addendum)
  Last Vitals:  Vitals Value Taken Time  BP 114/63 08/11/2017 11:40 AM  Temp    Pulse 52 08/11/2017 11:42 AM  Resp    SpO2 95 % 08/11/2017 11:42 AM  Vitals shown include unvalidated device data.  Last Pain:  Vitals:   08/11/17 0738  TempSrc:   PainSc: 0-No pain      Patients Stated Pain Goal: 5 (08/11/17 2449)  Immediate Anesthesia Transfer of Care Note  Patient: Tristan Kennedy  Procedure(s) Performed: Procedure(s) (LRB): RADIOACTIVE SEED IMPLANT/BRACHYTHERAPY IMPLANT (N/A) SPACE OAR INSTILLATION (N/A) CYSTOSCOPY (N/A)  Patient Location: PACU  Anesthesia Type: General  Level of Consciousness: drowsy  Airway & Oxygen Therapy: Patient Spontanous Breathing and Patient connected to nasal cannula oxygen, oral airway remaining  Post-op Assessment: Report given to PACU RN and Post -op Vital signs reviewed and stable  Post vital signs: Reviewed and stable  Complications: No apparent anesthesia complications

## 2017-08-11 NOTE — Op Note (Signed)
PATIENT:  Auther R Golinski  PRE-OPERATIVE DIAGNOSIS:  Adenocarcinoma of the prostate  POST-OPERATIVE DIAGNOSIS:  Same  PROCEDURE:  1. I-125 radioactive seed implantation 2. Cystoscopy  3. Placement of SpaceOAR  SURGEON:  Surgeon(s): Claybon Jabs  Radiation oncologist: Dr. Tyler Pita  ANESTHESIA:  General  EBL:  Minimal  DRAINS: None  INDICATION: SHUBH CHIARA is an 83 year old male with high risk adenocarcinoma the prostate (T2a, Gleason 8) who has undergone neoadjuvant ADT after having undergone a negative metastatic work-up.  He presents today for I-125 seed implant with the plan for him to then receive additional external radiation as well.  Description of procedure: After informed consent the patient was brought to the major OR, placed on the table and administered general anesthesia. He was then moved to the modified lithotomy position with his perineum perpendicular to the floor. His perineum and genitalia were then sterilely prepped. An official timeout was then performed. A 16 French Foley catheter was then placed in the bladder and filled with dilute contrast, a rectal tube was placed in the rectum and the transrectal ultrasound probe was placed in the rectum and affixed to the stand. He was then sterilely draped.  Real time ultrasonography was used along with the seed planning software [employer] Prostate vs. 4.2.2.4. This was used to develop the seed plan including the number of needles as well as number of seeds required for complete and adequate coverage.  The needles were then preloaded with seeds and spacers according to the previously developed plan.  Real-time ultrasonography was then used along with the previously developed plan to implant a total of 59 seeds using 26 needles. This proceeded without difficulty or complication.   I then proceeded with placement of SpaceOAR by introducing a needle with the bevel angled inferiorly approximately 2 cm superior to the  anus. This was angled downward and under direct ultrasound was placed within the space between the prostatic capsule and rectum. This was confirmed with a small amount of sterile saline injected and this was performed under direct ultrasound. I then attached the SpaceOAR to the needle and injected this in the space between the prostate and rectum with good placement noted.  A Foley catheter was then removed as well as the transrectal ultrasound probe and rectal probe. Flexible cystoscopy was then performed using the 17 French flexible scope which revealed a normal urethra throughout its length down to the sphincter which appeared intact. The prostatic urethra revealed bilobar hypertrophy but no evidence of obstruction, seeds, spacers or lesions. The bladder was then entered and fully and systematically inspected. The ureteral orifices were noted to be of normal configuration and position. The mucosa revealed no evidence of tumors. There were also no stones identified within the bladder. I noted no seeds or spacers on the floor of the bladder and retroflexion of the scope revealed no seeds protruding from the base of the prostate.  The cystoscope was then removed and the patient was awakened and taken to recovery room in stable and satisfactory condition. He tolerated procedure well and there were no intraoperative complications.

## 2017-08-11 NOTE — Anesthesia Preprocedure Evaluation (Addendum)
Anesthesia Evaluation  Patient identified by MRN, date of birth, ID band Patient awake    Reviewed: Allergy & Precautions, NPO status , Patient's Chart, lab work & pertinent test results, reviewed documented beta blocker date and time   History of Anesthesia Complications Negative for: history of anesthetic complications  Airway Mallampati: II  TM Distance: >3 FB Neck ROM: Full    Dental  (+) Dental Advisory Given, Edentulous Upper, Edentulous Lower   Pulmonary neg pulmonary ROS,    Pulmonary exam normal        Cardiovascular hypertension, Pt. on medications and Pt. on home beta blockers + CAD and + Peripheral Vascular Disease  Normal cardiovascular exam     Neuro/Psych  Headaches, PSYCHIATRIC DISORDERS Anxiety Depression negative psych ROS   GI/Hepatic Neg liver ROS, PUD, GERD  ,  Endo/Other  negative endocrine ROS  Renal/GU negative Renal ROS  negative genitourinary   Musculoskeletal negative musculoskeletal ROS (+)   Abdominal   Peds negative pediatric ROS (+)  Hematology negative hematology ROS (+)   Anesthesia Other Findings   Reproductive/Obstetrics negative OB ROS                            Anesthesia Physical Anesthesia Plan  ASA: III  Anesthesia Plan: General   Post-op Pain Management:    Induction: Intravenous  PONV Risk Score and Plan: 3 and Ondansetron, Dexamethasone and Diphenhydramine  Airway Management Planned: LMA and Oral ETT  Additional Equipment:   Intra-op Plan:   Post-operative Plan: Extubation in OR  Informed Consent: I have reviewed the patients History and Physical, chart, labs and discussed the procedure including the risks, benefits and alternatives for the proposed anesthesia with the patient or authorized representative who has indicated his/her understanding and acceptance.   Dental advisory given  Plan Discussed with: CRNA and  Anesthesiologist  Anesthesia Plan Comments:         Anesthesia Quick Evaluation

## 2017-08-11 NOTE — Anesthesia Postprocedure Evaluation (Signed)
Anesthesia Post Note  Patient: Arshan R Thelin  Procedure(s) Performed: RADIOACTIVE SEED IMPLANT/BRACHYTHERAPY IMPLANT (N/A Prostate) SPACE OAR INSTILLATION (N/A Rectum) CYSTOSCOPY (N/A Bladder)     Patient location during evaluation: PACU Anesthesia Type: General Level of consciousness: sedated Pain management: pain level controlled Vital Signs Assessment: post-procedure vital signs reviewed and stable Respiratory status: spontaneous breathing and respiratory function stable Cardiovascular status: stable Postop Assessment: no apparent nausea or vomiting Anesthetic complications: no    Last Vitals:  Vitals:   08/11/17 1230 08/11/17 1245  BP: (!) 141/74 132/72  Pulse: 65 (!) 56  Resp: 12 13  Temp:    SpO2: 97% 99%    Last Pain:  Vitals:   08/11/17 1230  TempSrc:   PainSc: 0-No pain                 Darnetta Kesselman DANIEL

## 2017-08-11 NOTE — Anesthesia Procedure Notes (Signed)
Procedure Name: LMA Insertion Date/Time: 08/11/2017 10:26 AM Performed by: Duane Boston, MD Pre-anesthesia Checklist: Patient identified, Emergency Drugs available, Suction available and Patient being monitored Patient Re-evaluated:Patient Re-evaluated prior to induction Oxygen Delivery Method: Circle system utilized Preoxygenation: Pre-oxygenation with 100% oxygen Induction Type: IV induction Ventilation: Mask ventilation without difficulty LMA: LMA inserted LMA Size: 4.0 Number of attempts: 1 Airway Equipment and Method: Bite block Placement Confirmation: positive ETCO2 Tube secured with: Tape Dental Injury: Teeth and Oropharynx as per pre-operative assessment

## 2017-08-11 NOTE — Discharge Instructions (Signed)
DISCHARGE INSTRUCTIONS FOR PROSTATE SEED IMPLANTATION  Removal of catheter Remove the foley catheter after 24 hours ( day after the procedure).can be done easily by cutting the side port of the catheter, which will allow the balloon to deflate.  You will see 1-2 teaspoons of clear water as the balloon deflates and then the catheter can be slid out without difficulty.        Cut here  Antibiotics You may be given a prescription for an antibiotic to take when you arrive home. If so, be sure to take every tablet in the bottle, even if you are feeling better before the prescription is finished. If you begin itching, notice a rash or start to swell on your trunk, arms, legs and/or throat, immediately stop taking the antibiotic and call your Urologist. Diet Resume your usual diet when you return home. To keep your bowels moving easily and softly, drink prune, apple and cranberry juice at room temperature. You may also take a stool softener, such as Colace, which is available without prescription at local pharmacies. Daily activities ? No driving or heavy lifting for at least two days after the implant. ? No bike riding, horseback riding or riding lawn mowers for the first month after the implant. ? Any strenuous physical activity should be approved by your doctor before you resume it. Sexual relations You may resume sexual relations two weeks after the procedure. A condom should be used for the first two weeks. Your semen may be dark brown or black; this is normal and is related bleeding that may have occurred during the implant. Postoperative swelling Expect swelling and bruising of the scrotum and perineum (the area between the scrotum and anus). Both the swelling and the bruising should resolve in l or 2 weeks. Ice packs and over- the-counter medications such as Tylenol, Advil or Aleve may lessen your discomfort. Postoperative urination Most men experience burning on urination and/or urinary  frequency. If this becomes bothersome, contact your Urologist.  Medication can be prescribed to relieve these problems.  It is normal to have some blood in your urine for a few days after the implant. Special instructions related to the seeds It is unlikely that you will pass an Iodine-125 seed in your urine. The seeds are silver in color and are about as large as a grain of rice. If you pass a seed, do not handle it with your fingers. Use a spoon to place it in an envelope or jar in place this in base occluded area such as the garage or basement for return to the radiation clinic at your convenience.  Contact your doctor for ? Temperature greater than 101 F ? Increasing pain ? Inability to urinate Follow-up  You should have follow up with your urologist and radiation oncologist about 3 weeks after the procedure. General information regarding Iodine seeds ? Iodine-125 is a low energy radioactive material. It is not deeply penetrating and loses energy at short distances. Your prostate will absorb the radiation. Objects that are touched or used by the patient do not become radioactive. ? Body wastes (urine and stool) or body fluids (saliva, tears, semen or blood) are not radioactive. ? The Nuclear Regulatory Commission [facility]) has determined that no radiation precautions are needed for patients undergoing Iodine-125 seed implantation. The [facility] states that such patients do not present a risk to the people around them, including young children and pregnant women. However, in keeping with the general principle that radiation exposure should be kept as  low reasonably possible, we suggest the following: ? Children and pets should not sit on the patient's lap for the first two (2) weeks after the implant. ? Pregnant (or possibly pregnant) women should avoid prolonged, close contact with the patient for the first two (2) weeks after the implant. ? A distance of three (3) feet is acceptable. ? At a distance of  three (3) feet, there is no limit to the length of time anyone can be with the patient.     Post Anesthesia Home Care Instructions  Activity: Get plenty of rest for the remainder of the day. A responsible individual must stay with you for 24 hours following the procedure.  For the next 24 hours, DO NOT: -Drive a car -Paediatric nurse -Drink alcoholic beverages -Take any medication unless instructed by your physician -Make any legal decisions or sign important papers.  Meals: Start with liquid foods such as gelatin or soup. Progress to regular foods as tolerated. Avoid greasy, spicy, heavy foods. If nausea and/or vomiting occur, drink only clear liquids until the nausea and/or vomiting subsides. Call your physician if vomiting continues.  Special Instructions/Symptoms: Your throat may feel dry or sore from the anesthesia or the breathing tube placed in your throat during surgery. If this causes discomfort, gargle with warm salt water. The discomfort should disappear within 24 hours.

## 2017-08-12 ENCOUNTER — Encounter (HOSPITAL_BASED_OUTPATIENT_CLINIC_OR_DEPARTMENT_OTHER): Payer: Self-pay | Admitting: Urology

## 2017-08-15 ENCOUNTER — Telehealth: Payer: Self-pay | Admitting: *Deleted

## 2017-08-15 NOTE — Telephone Encounter (Signed)
RETURNED PATIENT'S PHONE CALL, NO ANSWER ?

## 2017-08-20 ENCOUNTER — Ambulatory Visit (INDEPENDENT_AMBULATORY_CARE_PROVIDER_SITE_OTHER): Payer: Medicare Other | Admitting: Cardiology

## 2017-08-20 ENCOUNTER — Encounter: Payer: Self-pay | Admitting: Cardiology

## 2017-08-20 VITALS — BP 144/72 | HR 62 | Ht 69.0 in | Wt 187.0 lb

## 2017-08-20 DIAGNOSIS — I10 Essential (primary) hypertension: Secondary | ICD-10-CM

## 2017-08-20 DIAGNOSIS — I493 Ventricular premature depolarization: Secondary | ICD-10-CM | POA: Diagnosis not present

## 2017-08-20 DIAGNOSIS — I25118 Atherosclerotic heart disease of native coronary artery with other forms of angina pectoris: Secondary | ICD-10-CM

## 2017-08-20 DIAGNOSIS — E785 Hyperlipidemia, unspecified: Secondary | ICD-10-CM | POA: Diagnosis not present

## 2017-08-20 NOTE — Patient Instructions (Signed)

## 2017-08-20 NOTE — Progress Notes (Signed)
Cardiology Office Note:    Date:  08/20/2017   ID:  Tristan Kennedy, DOB 1934/12/03, MRN [id number]  PCP:  Myrlene Broker, MD  Cardiologist:  Shirlee More, MD    Referring MD: Myrlene Broker, MD    ASSESSMENT:    1. Coronary artery disease of native artery of native heart with stable angina pectoris ([place])   2. Essential hypertension   3. Frequent PVCs   4. Hyperlipidemia, unspecified hyperlipidemia type    PLAN:    In order of problems listed above:  1. Stable CAD at this time I continue his current medical treatment including aspirin statin and antihypertensive.  I do not think he requires an ischemia evaluation at this time. 2. Stable continue current treatment including ACE inhibitor.  Recent labs show normal renal function 3. Stable asymptomatic none present on recent EKG independently reviewed and continue his beta-blocker 4. Stable continue with statin   Next appointment: One year yes   Medication Adjustments/Labs and Tests Ordered: Current medicines are reviewed at length with the patient today.  Concerns regarding medicines are outlined above.  No orders of the defined types were placed in this encounter.  No orders of the defined types were placed in this encounter.   Chief Complaint  Patient presents with  . Coronary Artery Disease    and PVC's  . Diabetes Mellitus  . Hyperlipidemia    History of Present Illness:    Tristan Kennedy is a 83 y.o. male with a hx of CAD, Dyslipidemia, HTN, and PVC's   last seen 12/16/16. Compliance with diet, lifestyle and medications: Yes  He is struggling with the treatments for his prostate cancer particularly emotionally with androgen deprivation therapy.  He has had no cardiac issues with his procedures he has had no chest pain dyspnea palpitation or syncope and is compliant with his current medications. Past Medical History:  Diagnosis Date  . Anxiety   . Aortic atherosclerosis ([place]) 04/29/2017  . Arthritis     . Cardiomyopathy ([place]) 12/12/2016   EF 50% in July 2012 by echo    . Depression   . Essential hypertension 08/15/2014  . Fatigue   . 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 GERD (gastroesophageal reflux disease)   . History of bradycardia    as low as the 20's  . History of kidney stones   . HLD (hyperlipidemia)   . HTN (hypertension)   . Hyperlipidemia 08/15/2014  . Migraines   . Mild CAD 08/15/2014  . Paroxysmal VT ([place]) 12/12/2016  . Prostate cancer ([place])   . PUD (peptic ulcer disease)   . Sigmoid diverticulosis 04/29/2017   Marked  . Skin cancer    face and back  . Spinal stenosis, lumbar     Past Surgical History:  Procedure Laterality Date  . CARDIAC CATHETERIZATION    . COLONOSCOPY    . CYSTOSCOPY N/A 08/11/2017   Procedure: CYSTOSCOPY;  Surgeon: Kathie Rhodes, MD;  [facility];  Service: Urology;  Laterality: N/A;  NO SEEDS IN BLADDER PER DR OTTELIN  . KIDNEY STONE SURGERY  2000  . PROSTATE BIOPSY  04/08/2017  . RADIOACTIVE SEED IMPLANT N/A 08/11/2017   Procedure: RADIOACTIVE SEED IMPLANT/BRACHYTHERAPY IMPLANT;  Surgeon: Kathie Rhodes, MD;  [facility];  Service: Urology;  Laterality: N/A;  33 SEEDS  . SPACE OAR INSTILLATION N/A 08/11/2017   Procedure: SPACE OAR INSTILLATION;  Surgeon: Kathie Rhodes, MD;  Location[facility];  Service: Urology;  Laterality: N/A;    Current Medications: Current Meds  Medication Sig  . acebutolol (SECTRAL) 200 MG capsule Take 1 capsule (200 mg total) by mouth daily. He takes it 5 days a week  . aspirin 81 MG tablet Take 81 mg by mouth daily.    Marland Kitchen Bioflavonoid Products (ESTER-C) 500-50 MG TABS Take by mouth daily.    . busPIRone (BUSPAR) 15 MG tablet TAKE 1 TABLET TWICE DAILY  . Calcium Carbonate-Vit D-Min (CALCIUM 1200 PO) Take by mouth. Calcium 1200 slow release  . ciprofloxacin (CIPRO) 500 MG tablet Take 1 tablet (500 mg total) by mouth 2 (two) times daily.  Marland Kitchen FIBER COMPLETE PO Take  by mouth.  Marland Kitchen HYDROcodone-acetaminophen (NORCO) 10-325 MG tablet Take 1-2 tablets by mouth every 4 (four) hours as needed for moderate pain. Maximum dose per 24 hours -8 pills.  Marland Kitchen lisinopril (PRINIVIL,ZESTRIL) 10 MG tablet Take 1 tablet (10 mg total) by mouth daily.  Marland Kitchen LORazepam (ATIVAN) 0.5 MG tablet Take 0.5 mg by mouth 2 (two) times daily.  . magnesium oxide (MAG-OX) 400 MG tablet Take 800 mg by mouth daily.    . Multiple Vitamins-Minerals (MULTIVITAMIN WITH MINERALS) tablet Take by mouth.  . Omega-3 Fatty Acids (FISH OIL) 1200 MG CAPS Take by mouth 2 (two) times daily.  . ranitidine (ZANTAC) 300 MG capsule Take 300 mg by mouth every evening.    . sertraline (ZOLOFT) 50 MG tablet Take 150 mg by mouth daily.   . simvastatin (ZOCOR) 40 MG tablet Take 1 tablet (40 mg total) by mouth at bedtime.  Marland Kitchen tiZANidine (ZANAFLEX) 4 MG tablet Take 4 mg by mouth at bedtime.  . traMADol (ULTRAM) 50 MG tablet TAKE ONE TABLET BY MOUTH EVERY TWELVE HOURS AS NEEDED     Allergies:   Patient has no known allergies.   Social History   Socioeconomic History  . Marital status: Married    Spouse name: Not on file  . Number of children: 2  . Years of education: Not on file  . Highest education level: Not on file  Occupational History  . Occupation: maintance  Social Needs  . Financial resource strain: Not on file  . Food insecurity:    Worry: Not on file    Inability: Not on file  . Transportation needs:    Medical: Not on file    Non-medical: Not on file  Tobacco Use  . Smoking status: Never Smoker  . Smokeless tobacco: Never Used  Substance and Sexual Activity  . Alcohol use: Never    Frequency: Never  . Drug use: Never  . Sexual activity: Yes  Lifestyle  . Physical activity:    Days per week: Not on file    Minutes per session: Not on file  . Stress: Not on file  Relationships  . Social connections:    Talks on phone: Not on file    Gets together: Not on file    Attends religious service:  Not on file    Active member of club or organization: Not on file    Attends meetings of clubs or organizations: Not on file    Relationship status: Not on file  Other Topics Concern  . Not on file  Social History Narrative  . Not on file     Family History: The patient's family history includes Cancer in his paternal uncle; Diabetes in his unknown relative; Hypertension in his unknown relative; Stroke in his unknown relative. ROS:   Please see the history  of present illness.    All other systems reviewed and are negative.  EKGs/Labs/Other Studies Reviewed:    The following studies were reviewed today:  EKG:  06/26/17 [place] normal independently reviewed  Recent Labs: 08/04/2017: ALT 20; BUN 25; Creatinine, Ser 1.40; Hemoglobin 13.2; Platelets 112; Potassium 5.1; Sodium 145  Recent Lipid Panel    Component Value Date/Time   CHOL 146 12/16/2016 1543   TRIG 162 (H) 12/16/2016 1543   HDL 48 12/16/2016 1543   CHOLHDL 3.0 12/16/2016 1543   LDLCALC 66 12/16/2016 1543    Physical Exam:    VS:  BP (!) 144/72 (BP Location: Right Arm, Patient Position: Sitting, Cuff Size: Normal)   Pulse 62   Ht 5\' 9"  (1.753 m)   Wt 187 lb (84.8 kg)   SpO2 98%   BMI 27.62 kg/m     Wt Readings from Last 3 Encounters:  08/20/17 187 lb (84.8 kg)  08/11/17 186 lb 4.8 oz (84.5 kg)  05/28/17 189 lb 6.4 oz (85.9 kg)     GEN:  Well nourished, well developed in no acute distress HEENT: Normal NECK: No JVD; No carotid bruits LYMPHATICS: No lymphadenopathy CARDIAC: RRR, no murmurs, rubs, gallops RESPIRATORY:  Clear to auscultation without rales, wheezing or rhonchi  ABDOMEN: Soft, non-tender, non-distended MUSCULOSKELETAL:  No edema; No deformity  SKIN: Warm and dry NEUROLOGIC:  Alert and oriented x 3 PSYCHIATRIC:  Normal affect    Signed, Shirlee More, MD  08/20/2017 11:37 AM    [place]

## 2017-08-26 ENCOUNTER — Telehealth: Payer: Self-pay | Admitting: *Deleted

## 2017-08-26 ENCOUNTER — Other Ambulatory Visit: Payer: Self-pay | Admitting: Urology

## 2017-08-26 DIAGNOSIS — C61 Malignant neoplasm of prostate: Secondary | ICD-10-CM

## 2017-08-26 NOTE — Telephone Encounter (Signed)
CALLED PATIENT TO INFORM OF MRI, NO ANSWER WILL CALL LATER

## 2017-08-27 ENCOUNTER — Telehealth: Payer: Self-pay | Admitting: *Deleted

## 2017-08-27 NOTE — Telephone Encounter (Signed)
Called patient to remind of post seed appts for 08-28-17 and his MRI for 08-30-17- arrival time- 9:30 am @ [facility] , no restrictions to test, spoke with patient and he is aware of these appts.

## 2017-08-28 ENCOUNTER — Ambulatory Visit
Admission: RE | Admit: 2017-08-28 | Discharge: 2017-08-28 | Disposition: A | Discharge status: Home / Self Care | Payer: Medicare Other | Source: Ambulatory Visit | Attending: Radiation Oncology | Admitting: Radiation Oncology

## 2017-08-28 ENCOUNTER — Encounter: Payer: Self-pay | Admitting: Medical Oncology

## 2017-08-28 DIAGNOSIS — C61 Malignant neoplasm of prostate: Secondary | ICD-10-CM | POA: Diagnosis not present

## 2017-08-28 DIAGNOSIS — Z510 Encounter for antineoplastic radiation therapy: Secondary | ICD-10-CM | POA: Insufficient documentation

## 2017-08-28 NOTE — Progress Notes (Signed)
  Radiation Oncology         [phone] ________________________________  Name: Tristan Kennedy MRN: [id number]  Date: 08/28/2017  DOB: 1934/12/02  COMPLEX SIMULATION NOTE  NARRATIVE:  The patient was brought to the [facility] today following prostate seed implantation approximately one month ago.  Identity was confirmed.  All relevant records and images related to the planned course of therapy were reviewed.  Then, the patient was set-up supine.  CT images were obtained.  The CT images were loaded into the planning software.  Then the prostate and rectum were contoured.  Treatment planning then occurred.  The implanted iodine 125 seeds were identified by the physics staff for projection of radiation distribution  I have requested : 3D Simulation  I have requested a DVH of the following structures: Prostate and rectum.    ________________________________  Sheral Apley Tammi Klippel, M.D.  This document serves as a record of services personally performed by Tyler Pita MD. It was created on his behalf by Delton Coombes, a trained medical scribe. The creation of this record is based on the scribe's personal observations and the provider's statements to them.

## 2017-08-30 ENCOUNTER — Ambulatory Visit (HOSPITAL_COMMUNITY)
Admission: RE | Admit: 2017-08-30 | Discharge: 2017-08-30 | Disposition: A | Discharge status: Home / Self Care | Payer: Medicare Other | Source: Ambulatory Visit | Attending: Urology | Admitting: Urology

## 2017-08-30 DIAGNOSIS — C61 Malignant neoplasm of prostate: Secondary | ICD-10-CM | POA: Diagnosis not present

## 2017-08-30 NOTE — Progress Notes (Signed)
Radiation Oncology         (336) [phone] ________________________________  Name: Tristan Kennedy MRN: [id number]  Date: 08/28/2017  DOB: [birth year]  Follow-Up Visit Note  CC: Myrlene Broker, MD  Kathie Rhodes, MD  Diagnosis:   83 y.o. gentleman with stage T2a adenocarcinoma of the prostate with a Gleason score of 4+4 and PSA of 2.4.    ICD-10-CM   1. Malignant neoplasm of prostate (HCC) C61     Interval Since Last Radiation:  3 weeks  Narrative:  The patient returns today for routine follow-up.  He is complaining of increased urinary frequency and urinary hesitation symptoms. He filled out a questionnaire regarding urinary function. He denies any bowel symptoms.  ALLERGIES:  has No Known Allergies.  Meds: Current Outpatient Medications  Medication Sig Dispense Refill  . acebutolol (SECTRAL) 200 MG capsule Take 1 capsule (200 mg total) by mouth daily. He takes it 5 days a week 90 capsule 2  . aspirin 81 MG tablet Take 81 mg by mouth daily.      Marland Kitchen Bioflavonoid Products (ESTER-C) 500-50 MG TABS Take by mouth daily.      . busPIRone (BUSPAR) 15 MG tablet TAKE 1 TABLET TWICE DAILY    . Calcium Carbonate-Vit D-Min (CALCIUM 1200 PO) Take by mouth. Calcium 1200 slow release    . ciprofloxacin (CIPRO) 500 MG tablet Take 1 tablet (500 mg total) by mouth 2 (two) times daily. 6 tablet 0  . FIBER COMPLETE PO Take by mouth.    Marland Kitchen HYDROcodone-acetaminophen (NORCO) 10-325 MG tablet Take 1-2 tablets by mouth every 4 (four) hours as needed for moderate pain. Maximum dose per 24 hours -8 pills. 20 tablet 0  . lisinopril (PRINIVIL,ZESTRIL) 10 MG tablet Take 1 tablet (10 mg total) by mouth daily. 90 tablet 1  . LORazepam (ATIVAN) 0.5 MG tablet Take 0.5 mg by mouth 2 (two) times daily.    . magnesium oxide (MAG-OX) 400 MG tablet Take 800 mg by mouth daily.      . Multiple Vitamins-Minerals (MULTIVITAMIN WITH MINERALS) tablet Take by mouth.    . Omega-3 Fatty Acids (FISH OIL) 1200 MG CAPS Take by  mouth 2 (two) times daily.    . ranitidine (ZANTAC) 300 MG capsule Take 300 mg by mouth every evening.      . sertraline (ZOLOFT) 50 MG tablet Take 150 mg by mouth daily.     . simvastatin (ZOCOR) 40 MG tablet Take 1 tablet (40 mg total) by mouth at bedtime. 30 tablet 11  . tiZANidine (ZANAFLEX) 4 MG tablet Take 4 mg by mouth at bedtime.    . traMADol (ULTRAM) 50 MG tablet TAKE ONE TABLET BY MOUTH EVERY TWELVE HOURS AS NEEDED  0   No current facility-administered medications for this encounter.     Physical Findings: The patient is in no acute distress. Patient is alert and oriented.  vitals were not taken for this visit. .  No significant changes.  Lab Findings: Lab Results  Component Value Date   WBC 5.0 08/04/2017   HGB 13.2 08/04/2017   HCT 39.3 08/04/2017   MCV 88.3 08/04/2017   PLT 112 (L) 08/04/2017    Radiographic Findings:  Patient underwent CT imaging in our clinic for post implant dosimetry. The CT appears to demonstrate an adequate distribution of radioactive seeds throughout the prostate gland. There no seeds in her near the rectum. I suspect the final radiation plan and dosimetry will show appropriate coverage of the prostate gland.  Impression: The patient is recovering from the effects of radiation. His urinary symptoms should gradually improve over the next 4-6 months. We talked about this today. He is encouraged by his improvement already and is otherwise please with his outcome.   Plan: Today, I spent time talking to the patient about his prostate seed implant and resolving urinary symptoms. We also talked about long-term follow-up for prostate cancer following seed implant. He understands that ongoing PSA determinations and digital rectal exams will help perform surveillance to rule out disease recurrence. He understands what to expect with his PSA measures. Patient was also educated today about some of the long-term effects from radiation including a small risk for  rectal bleeding and possibly erectile dysfunction. We talked about some of the general management approaches to these potential complications. However, I did encourage the patient to contact our office or return at any point if he has questions or concerns related to his previous radiation and prostate cancer.  _____________________________________  Sheral Apley. Tammi Klippel, M.D.

## 2017-09-06 DIAGNOSIS — C61 Malignant neoplasm of prostate: Secondary | ICD-10-CM | POA: Diagnosis not present

## 2017-09-09 ENCOUNTER — Ambulatory Visit
Admission: RE | Admit: 2017-09-09 | Discharge: 2017-09-09 | Disposition: A | Discharge status: Home / Self Care | Payer: Medicare Other | Source: Ambulatory Visit | Attending: Radiation Oncology | Admitting: Radiation Oncology

## 2017-09-09 DIAGNOSIS — C61 Malignant neoplasm of prostate: Secondary | ICD-10-CM | POA: Diagnosis not present

## 2017-09-10 ENCOUNTER — Ambulatory Visit
Admission: RE | Admit: 2017-09-10 | Discharge: 2017-09-10 | Disposition: A | Discharge status: Home / Self Care | Payer: Medicare Other | Source: Ambulatory Visit | Attending: Radiation Oncology | Admitting: Radiation Oncology

## 2017-09-10 DIAGNOSIS — C61 Malignant neoplasm of prostate: Secondary | ICD-10-CM | POA: Diagnosis not present

## 2017-09-11 ENCOUNTER — Ambulatory Visit
Admission: RE | Admit: 2017-09-11 | Discharge: 2017-09-11 | Disposition: A | Discharge status: Home / Self Care | Payer: Medicare Other | Source: Ambulatory Visit | Attending: Radiation Oncology | Admitting: Radiation Oncology

## 2017-09-11 DIAGNOSIS — C61 Malignant neoplasm of prostate: Secondary | ICD-10-CM | POA: Diagnosis not present

## 2017-09-12 ENCOUNTER — Ambulatory Visit
Admission: RE | Admit: 2017-09-12 | Discharge: 2017-09-12 | Disposition: A | Discharge status: Home / Self Care | Payer: Medicare Other | Source: Ambulatory Visit | Attending: Radiation Oncology | Admitting: Radiation Oncology

## 2017-09-12 ENCOUNTER — Encounter: Payer: Self-pay | Admitting: Radiation Oncology

## 2017-09-12 DIAGNOSIS — C61 Malignant neoplasm of prostate: Secondary | ICD-10-CM | POA: Diagnosis not present

## 2017-09-12 NOTE — Progress Notes (Signed)
Pt here for patient teaching.  Pt given Radiation and You booklet.  Reviewed areas of pertinence such as diarrhea, fatigue, sexual and fertility changes and urinary and bladder changes . Pt able to give teach back of have Imodium on hand and drink plenty of water,. Pt demonstrated understanding, needs reinforcement, no evidence of learning, refused teaching and  of information given and will contact nursing with any questions or concerns.     [email or web address]

## 2017-09-15 NOTE — Progress Notes (Signed)
  Radiation Oncology         (336) [phone] ________________________________  Name: Tristan Kennedy MRN: [id number]  Date: 09/12/2017  DOB: May 29, 1934  3D Planning Note   Prostate Brachytherapy Post-Implant Dosimetry  Diagnosis: 83 y.o. gentleman with stage T2a adenocarcinoma of the prostate with a Gleason score of 4+4 and PSA of 2.4  Narrative: On a previous date, Tristan Kennedy returned following prostate seed implantation for post implant planning. He underwent CT scan complex simulation to delineate the three-dimensional structures of the pelvis and demonstrate the radiation distribution.  Since that time, the seed localization, and complex isodose planning with dose volume histograms have now been completed.  Results:   Prostate Coverage - The dose of radiation delivered to the 90% or more of the prostate gland (D90) was 98.75% of the prescription dose. This exceeds our goal of greater than 90%. Rectal Sparing - The volume of rectal tissue receiving the prescription dose or higher was 0.0 cc. This falls under our thresholds tolerance of 1.0 cc.  Impression: The prostate seed implant appears to show adequate target coverage and appropriate rectal sparing.  Plan:  The patient will continue to follow with urology for ongoing PSA determinations. I would anticipate a high likelihood for local tumor control with minimal risk for rectal morbidity.  ________________________________  Sheral Apley Tammi Klippel, M.D.

## 2017-09-16 ENCOUNTER — Ambulatory Visit
Admission: RE | Admit: 2017-09-16 | Discharge: 2017-09-16 | Disposition: A | Discharge status: Home / Self Care | Payer: Medicare Other | Source: Ambulatory Visit | Attending: Radiation Oncology | Admitting: Radiation Oncology

## 2017-09-16 DIAGNOSIS — C61 Malignant neoplasm of prostate: Secondary | ICD-10-CM | POA: Diagnosis not present

## 2017-09-16 DIAGNOSIS — Z510 Encounter for antineoplastic radiation therapy: Secondary | ICD-10-CM | POA: Diagnosis not present

## 2017-09-17 ENCOUNTER — Ambulatory Visit
Admission: RE | Admit: 2017-09-17 | Discharge: 2017-09-17 | Disposition: A | Discharge status: Home / Self Care | Payer: Medicare Other | Source: Ambulatory Visit | Attending: Radiation Oncology | Admitting: Radiation Oncology

## 2017-09-17 DIAGNOSIS — C61 Malignant neoplasm of prostate: Secondary | ICD-10-CM | POA: Diagnosis not present

## 2017-09-18 ENCOUNTER — Ambulatory Visit
Admission: RE | Admit: 2017-09-18 | Discharge: 2017-09-18 | Disposition: A | Discharge status: Home / Self Care | Payer: Medicare Other | Source: Ambulatory Visit | Attending: Radiation Oncology | Admitting: Radiation Oncology

## 2017-09-18 DIAGNOSIS — C61 Malignant neoplasm of prostate: Secondary | ICD-10-CM | POA: Diagnosis not present

## 2017-09-19 ENCOUNTER — Ambulatory Visit
Admission: RE | Admit: 2017-09-19 | Discharge: 2017-09-19 | Disposition: A | Discharge status: Home / Self Care | Payer: Medicare Other | Source: Ambulatory Visit | Attending: Radiation Oncology | Admitting: Radiation Oncology

## 2017-09-19 DIAGNOSIS — C61 Malignant neoplasm of prostate: Secondary | ICD-10-CM | POA: Diagnosis not present

## 2017-09-22 ENCOUNTER — Ambulatory Visit
Admission: RE | Admit: 2017-09-22 | Discharge: 2017-09-22 | Disposition: A | Discharge status: Home / Self Care | Payer: Medicare Other | Source: Ambulatory Visit | Attending: Radiation Oncology | Admitting: Radiation Oncology

## 2017-09-22 DIAGNOSIS — C61 Malignant neoplasm of prostate: Secondary | ICD-10-CM | POA: Diagnosis not present

## 2017-09-23 ENCOUNTER — Ambulatory Visit
Admission: RE | Admit: 2017-09-23 | Discharge: 2017-09-23 | Disposition: A | Discharge status: Home / Self Care | Payer: Medicare Other | Source: Ambulatory Visit | Attending: Radiation Oncology | Admitting: Radiation Oncology

## 2017-09-23 DIAGNOSIS — C61 Malignant neoplasm of prostate: Secondary | ICD-10-CM | POA: Diagnosis not present

## 2017-09-24 ENCOUNTER — Ambulatory Visit
Admission: RE | Admit: 2017-09-24 | Discharge: 2017-09-24 | Disposition: A | Discharge status: Home / Self Care | Payer: Medicare Other | Source: Ambulatory Visit | Attending: Radiation Oncology | Admitting: Radiation Oncology

## 2017-09-24 DIAGNOSIS — C61 Malignant neoplasm of prostate: Secondary | ICD-10-CM | POA: Diagnosis not present

## 2017-09-25 ENCOUNTER — Ambulatory Visit
Admission: RE | Admit: 2017-09-25 | Discharge: 2017-09-25 | Disposition: A | Discharge status: Home / Self Care | Payer: Medicare Other | Source: Ambulatory Visit | Attending: Radiation Oncology | Admitting: Radiation Oncology

## 2017-09-25 DIAGNOSIS — C61 Malignant neoplasm of prostate: Secondary | ICD-10-CM | POA: Diagnosis not present

## 2017-09-26 ENCOUNTER — Ambulatory Visit
Admission: RE | Admit: 2017-09-26 | Discharge: 2017-09-26 | Disposition: A | Discharge status: Home / Self Care | Payer: Medicare Other | Source: Ambulatory Visit | Attending: Radiation Oncology | Admitting: Radiation Oncology

## 2017-09-26 DIAGNOSIS — C61 Malignant neoplasm of prostate: Secondary | ICD-10-CM | POA: Diagnosis not present

## 2017-09-29 ENCOUNTER — Ambulatory Visit
Admission: RE | Admit: 2017-09-29 | Discharge: 2017-09-29 | Disposition: A | Discharge status: Home / Self Care | Payer: Medicare Other | Source: Ambulatory Visit | Attending: Radiation Oncology | Admitting: Radiation Oncology

## 2017-09-29 DIAGNOSIS — C61 Malignant neoplasm of prostate: Secondary | ICD-10-CM | POA: Diagnosis not present

## 2017-09-30 ENCOUNTER — Telehealth: Payer: Self-pay | Admitting: Radiation Oncology

## 2017-09-30 ENCOUNTER — Ambulatory Visit
Admission: RE | Admit: 2017-09-30 | Discharge: 2017-09-30 | Disposition: A | Discharge status: Home / Self Care | Payer: Medicare Other | Source: Ambulatory Visit | Attending: Radiation Oncology | Admitting: Radiation Oncology

## 2017-09-30 DIAGNOSIS — C61 Malignant neoplasm of prostate: Secondary | ICD-10-CM | POA: Diagnosis not present

## 2017-09-30 MED ORDER — HYDROCORTISONE ACETATE 25 MG RE SUPP: 25.0000 mg | Freq: Two times a day (BID) | RECTAL | 0 refills | Status: DC

## 2017-09-30 NOTE — Progress Notes (Signed)
Phoned patient with instructions per epic inbasket exchange with Shona Simpson, PA-C. Explained to the patient it is safe to take Imodium AD OTC to manage diarrhea. Also, explained that a script for Anusol suppositories have been escribed to [place] per Shona Simpson, PA-C order. Reviewed directions for use for both Anusol and Imodium. Patient verbalized understanding of all reviewed and expressed appreciation for the return call.

## 2017-10-01 ENCOUNTER — Ambulatory Visit
Admission: RE | Admit: 2017-10-01 | Discharge: 2017-10-01 | Disposition: A | Discharge status: Home / Self Care | Payer: Medicare Other | Source: Ambulatory Visit | Attending: Radiation Oncology | Admitting: Radiation Oncology

## 2017-10-01 DIAGNOSIS — C61 Malignant neoplasm of prostate: Secondary | ICD-10-CM | POA: Diagnosis not present

## 2017-10-02 ENCOUNTER — Ambulatory Visit
Admission: RE | Admit: 2017-10-02 | Discharge: 2017-10-02 | Disposition: A | Discharge status: Home / Self Care | Payer: Medicare Other | Source: Ambulatory Visit | Attending: Radiation Oncology | Admitting: Radiation Oncology

## 2017-10-02 DIAGNOSIS — C61 Malignant neoplasm of prostate: Secondary | ICD-10-CM | POA: Diagnosis not present

## 2017-10-03 ENCOUNTER — Ambulatory Visit
Admission: RE | Admit: 2017-10-03 | Discharge: 2017-10-03 | Disposition: A | Discharge status: Home / Self Care | Payer: Medicare Other | Source: Ambulatory Visit | Attending: Radiation Oncology | Admitting: Radiation Oncology

## 2017-10-03 DIAGNOSIS — C61 Malignant neoplasm of prostate: Secondary | ICD-10-CM | POA: Diagnosis not present

## 2017-10-06 ENCOUNTER — Ambulatory Visit
Admission: RE | Admit: 2017-10-06 | Discharge: 2017-10-06 | Disposition: A | Discharge status: Home / Self Care | Payer: Medicare Other | Source: Ambulatory Visit | Attending: Radiation Oncology | Admitting: Radiation Oncology

## 2017-10-06 DIAGNOSIS — C61 Malignant neoplasm of prostate: Secondary | ICD-10-CM | POA: Diagnosis not present

## 2017-10-07 ENCOUNTER — Ambulatory Visit
Admission: RE | Admit: 2017-10-07 | Discharge: 2017-10-07 | Disposition: A | Discharge status: Home / Self Care | Payer: Medicare Other | Source: Ambulatory Visit | Attending: Radiation Oncology | Admitting: Radiation Oncology

## 2017-10-07 DIAGNOSIS — C61 Malignant neoplasm of prostate: Secondary | ICD-10-CM | POA: Diagnosis not present

## 2017-10-08 ENCOUNTER — Ambulatory Visit
Admission: RE | Admit: 2017-10-08 | Discharge: 2017-10-08 | Disposition: A | Discharge status: Home / Self Care | Payer: Medicare Other | Source: Ambulatory Visit | Attending: Radiation Oncology | Admitting: Radiation Oncology

## 2017-10-08 DIAGNOSIS — C61 Malignant neoplasm of prostate: Secondary | ICD-10-CM | POA: Diagnosis not present

## 2017-10-09 ENCOUNTER — Ambulatory Visit
Admission: RE | Admit: 2017-10-09 | Discharge: 2017-10-09 | Disposition: A | Discharge status: Home / Self Care | Payer: Medicare Other | Source: Ambulatory Visit | Attending: Radiation Oncology | Admitting: Radiation Oncology

## 2017-10-09 DIAGNOSIS — C61 Malignant neoplasm of prostate: Secondary | ICD-10-CM | POA: Diagnosis not present

## 2017-10-10 ENCOUNTER — Ambulatory Visit
Admission: RE | Admit: 2017-10-10 | Discharge: 2017-10-10 | Disposition: A | Discharge status: Home / Self Care | Payer: Medicare Other | Source: Ambulatory Visit | Attending: Radiation Oncology | Admitting: Radiation Oncology

## 2017-10-10 ENCOUNTER — Other Ambulatory Visit: Payer: Self-pay | Admitting: Urology

## 2017-10-10 DIAGNOSIS — C61 Malignant neoplasm of prostate: Secondary | ICD-10-CM | POA: Diagnosis not present

## 2017-10-10 MED ORDER — HYDROCORTISONE ACETATE 25 MG RE SUPP: 25.0000 mg | Freq: Two times a day (BID) | RECTAL | 0 refills | Status: DC

## 2017-10-13 ENCOUNTER — Ambulatory Visit
Admission: RE | Admit: 2017-10-13 | Discharge: 2017-10-13 | Disposition: A | Discharge status: Home / Self Care | Payer: Medicare Other | Source: Ambulatory Visit | Attending: Radiation Oncology | Admitting: Radiation Oncology

## 2017-10-13 DIAGNOSIS — C61 Malignant neoplasm of prostate: Secondary | ICD-10-CM | POA: Diagnosis not present

## 2017-10-14 ENCOUNTER — Ambulatory Visit
Admission: RE | Admit: 2017-10-14 | Discharge: 2017-10-14 | Disposition: A | Discharge status: Home / Self Care | Payer: Medicare Other | Source: Ambulatory Visit | Attending: Radiation Oncology | Admitting: Radiation Oncology

## 2017-10-14 ENCOUNTER — Encounter: Payer: Self-pay | Admitting: Medical Oncology

## 2017-10-14 DIAGNOSIS — Z510 Encounter for antineoplastic radiation therapy: Secondary | ICD-10-CM | POA: Insufficient documentation

## 2017-10-14 DIAGNOSIS — C61 Malignant neoplasm of prostate: Secondary | ICD-10-CM | POA: Diagnosis present

## 2017-10-15 ENCOUNTER — Encounter: Payer: Self-pay | Admitting: Radiation Oncology

## 2017-10-15 NOTE — Progress Notes (Signed)
  Radiation Oncology         [phone]) [phone] ________________________________  Name: Tristan Kennedy MRN: [id number]  Date: 10/15/2017  DOB: Dec 23, 1934  End of Treatment Note  Diagnosis:   83 y.o. gentleman with stage T2a adenocarcinoma of the prostate with a Gleason score of 4+4 and PSA of 2.4     Indication for treatment:  Curative, Definitive Radiotherapy       Radiation treatment dates:  09/09/17 - 10/14/17  Site/dose:   The patient received an initial radioactive seed boost of 110 Gy on 08/11/17. The prostate was then treated to 45 Gy in 25 fractions of 1.8 Gy for a total nominal dose of 155 Gy.  Beams/energy:   The patient underwent insertion of radioactive I-125 seeds into the prostate gland. The patient was then treated with IMRT using volumetric arc therapy delivering 6 MV X-rays to clockwise and counterclockwise circumferential arcs with a 90 degree collimator offset to avoid dose scalloping.  Image guidance was performed with daily cone beam CT prior to each fraction to align to gold markers in the prostate and assure proper bladder and rectal fill volumes.  Immobilization was achieved with BodyFix custom mold.  Narrative: The patient tolerated radiation treatment relatively well.   He experienced some minor urinary irritation and modest fatigue.  Following the seed implant, he reported increased urinary frequency and hesitation and denied any bowel symptoms. He reported issues with hemorrhoids but experienced relief with hydrocortisone suppository and ice.  At the beginning of his daily treatments, he reported diarrhea which was relieved with imodium. Throughout treatment, he reported increasing nocturia from x1-2 to x4, mild dysuria, increasing fatigue, more frequent, soft stools, weakened force of stream, and increasing hesitancy. He noted improving urinary urgency and post void dribble near completion of treatment and denied hematuria, straining, and difficulty emptying his bladder  throughout treatment.  Plan: The patient has completed radiation treatment. He will return to radiation oncology clinic for routine followup in one month. I advised him to call or return sooner if he has any questions or concerns related to his recovery or treatment. ________________________________  Sheral Apley. Tammi Klippel, M.D.  This document serves as a record of services personally performed by Tyler Pita, MD. It was created on his behalf by Wilburn Mylar, a trained medical scribe. The creation of this record is based on the scribe's personal observations and the provider's statements to them. This document has been checked and approved by the attending provider.

## 2017-11-10 ENCOUNTER — Telehealth: Payer: Self-pay | Admitting: Radiation Oncology

## 2017-11-10 NOTE — Telephone Encounter (Signed)
Received voicemail from patient's wife requesting return call reference husband's hemorrhoids. Phoned patient's home. Spoke with wife and husband via speaker phone. Questioned if patient is using the Anusol suppositories prescribed by [employer], PA-C bid. Patient reports he is not because its too painful to insert them. Stressed the suppositories were necessary to heal the hemorrhoids. Encouraged patient to take Colace bid. Encouraged patient to use sitz bath followed by Tucks wipes and anusol suppository bid. Also, instructed patient to use baby wipes with aloe and increase water intake. Finally, instructed patient to alternate a pillow under each hip every hour to reduce rectal pressure if he is sitting for prolonged periods. Both patient and wife verbalized understanding. Confirmed follow up appointment next week with Ashlyn Bruning, PA-C. Instructed them to phone with future needs. Both verbalized understanding.

## 2017-11-14 ENCOUNTER — Other Ambulatory Visit: Payer: Self-pay | Admitting: Urology

## 2017-11-14 MED ORDER — HYDROCORTISONE ACETATE 25 MG RE SUPP: 25.0000 mg | Freq: Two times a day (BID) | RECTAL | 0 refills | Status: DC

## 2017-11-17 ENCOUNTER — Telehealth: Payer: Self-pay | Admitting: Radiation Oncology

## 2017-11-17 NOTE — Telephone Encounter (Signed)
Received voicemail message from patient requesting a refill of his Anusol suppositories. Informed Ashlyn, PA of these findings. She refilled the script electronically to [facility]. Phoned patient back and explained his refill had been sent. Patient verbalized understanding and expressed appreciation for the call.

## 2017-11-19 ENCOUNTER — Encounter: Payer: Self-pay | Admitting: Urology

## 2017-11-19 ENCOUNTER — Other Ambulatory Visit: Payer: Self-pay

## 2017-11-19 ENCOUNTER — Ambulatory Visit
Admission: RE | Admit: 2017-11-19 | Discharge: 2017-11-19 | Disposition: A | Discharge status: Home / Self Care | Payer: Medicare Other | Source: Ambulatory Visit | Attending: Urology | Admitting: Urology

## 2017-11-19 VITALS — BP 102/58 | Temp 97.8°F | Resp 18 | Ht 69.0 in | Wt 177.2 lb

## 2017-11-19 DIAGNOSIS — C61 Malignant neoplasm of prostate: Secondary | ICD-10-CM

## 2017-11-19 DIAGNOSIS — Z923 Personal history of irradiation: Secondary | ICD-10-CM | POA: Diagnosis not present

## 2017-11-19 DIAGNOSIS — Z7982 Long term (current) use of aspirin: Secondary | ICD-10-CM | POA: Insufficient documentation

## 2017-11-19 DIAGNOSIS — Z79899 Other long term (current) drug therapy: Secondary | ICD-10-CM | POA: Insufficient documentation

## 2017-11-19 DIAGNOSIS — E860 Dehydration: Secondary | ICD-10-CM | POA: Insufficient documentation

## 2017-11-19 DIAGNOSIS — R197 Diarrhea, unspecified: Secondary | ICD-10-CM | POA: Insufficient documentation

## 2017-11-20 NOTE — Progress Notes (Signed)
Radiation Oncology         [phone] ________________________________  Name: Tristan Kennedy MRN: [id number]  Date: 11/19/2017  DOB: [birth year]  Post Treatment Note  CC: Myrlene Broker, MD  Kathie Rhodes, MD  Diagnosis:   83 y.o.gentleman with stage T2a adenocarcinoma of the prostate with a Gleason score of 4+4 and PSA of 2.4   Interval Since Last Radiation:  5 weeks  09/09/17 - 10/14/17:  The patient received an initial radioactive seed boost of 110 Gy on 08/11/17. The prostate was then treated to 45 Gy in 25 fractions of 1.8 Gy for a total nominal dose of 155 Gy.  Narrative:  The patient returns today for routine follow-up.  He tolerated radiation treatment relatively well.   He experienced some minor urinary irritation and modest fatigue.  Following the seed implant, he reported increased urinary frequency and hesitation and denied any bowel symptoms. He reported issues with hemorrhoids but experienced relief with hydrocortisone suppository and ice.  At the beginning of his daily treatments, he reported diarrhea which was relieved with imodium. Throughout treatment, he reported increasing nocturia from x1-2 to x4, mild dysuria, increasing fatigue, more frequent, soft stools, weakened force of stream, and increasing hesitancy. He noted improving urinary urgency and post void dribble near completion of treatment and denied hematuria, straining, and difficulty emptying his bladder throughout treatment.                              On review of systems, the patient states that he is doing relatively well.  His lower urinary tract symptoms are much improved with a current IPSS score of 7 indicating mild symptoms only.  His only complaint at this point is that he continues with significant fatigue and generalized weakness which he attributes to ongoing issues with diarrhea.  He continues with 3-4 loose stools per day and admits to not drinking any significant amount of fluids and having minimal  solid intake as well due to decreased appetite and fear of increased bowel issues.  He has not been using Imodium because this tends to cause constipation.  His blood pressure is running low today in the office but he denies dizziness or imbalance.  He specifically denies dysuria, gross hematuria, excessive daytime frequency, urgency, incomplete emptying or incontinence.  He has not had recent fever, chills, nausea, vomiting or abdominal pain.  He continues to use Anusol suppositories for hemorrhoid relief and has noted significant improvement.  ALLERGIES:  has No Known Allergies.  Meds: Current Outpatient Medications  Medication Sig Dispense Refill  . acebutolol (SECTRAL) 200 MG capsule Take 1 capsule (200 mg total) by mouth daily. He takes it 5 days a week 90 capsule 2  . aspirin 81 MG tablet Take 81 mg by mouth daily.      Marland Kitchen Bioflavonoid Products (ESTER-C) 500-50 MG TABS Take by mouth daily.      . busPIRone (BUSPAR) 15 MG tablet TAKE 1 TABLET TWICE DAILY    . Calcium Carbonate-Vit D-Min (CALCIUM 1200 PO) Take by mouth. Calcium 1200 slow release    . FIBER COMPLETE PO Take by mouth.    . hydrocortisone (ANUSOL-HC) 25 MG suppository Place 1 suppository (25 mg total) rectally 2 (two) times daily. 24 suppository 0  . magnesium oxide (MAG-OX) 400 MG tablet Take 800 mg by mouth daily.      . Multiple Vitamins-Minerals (MULTIVITAMIN WITH MINERALS) tablet Take by mouth.    Marland Kitchen  Omega-3 Fatty Acids (FISH OIL) 1200 MG CAPS Take by mouth 2 (two) times daily.    . sertraline (ZOLOFT) 50 MG tablet Take 150 mg by mouth daily.     . simvastatin (ZOCOR) 40 MG tablet Take 1 tablet (40 mg total) by mouth at bedtime. 30 tablet 11  . traMADol (ULTRAM) 50 MG tablet TAKE ONE TABLET BY MOUTH EVERY TWELVE HOURS AS NEEDED  0  . traZODone (DESYREL) 50 MG tablet Take 50 mg by mouth at bedtime.    Marland Kitchen HYDROcodone-acetaminophen (NORCO) 10-325 MG tablet Take 1-2 tablets by mouth every 4 (four) hours as needed for moderate  pain. Maximum dose per 24 hours -8 pills. (Patient not taking: Reported on 11/19/2017) 20 tablet 0  . lisinopril (PRINIVIL,ZESTRIL) 10 MG tablet Take 1 tablet (10 mg total) by mouth daily. 90 tablet 1  . ranitidine (ZANTAC) 300 MG capsule Take 300 mg by mouth every evening.      Marland Kitchen tiZANidine (ZANAFLEX) 4 MG tablet Take 4 mg by mouth at bedtime.     No current facility-administered medications for this encounter.     Physical Findings:  height is 5\' 9"  (1.753 m) and weight is 177 lb 3.2 oz (80.4 kg). His oral temperature is 97.8 F (36.6 C). His blood pressure is 102/58 (abnormal). His respiration is 18 and oxygen saturation is 100%.  Pain Assessment Pain Score: 0-No pain/10 In general this is a well appearing Caucasian male in no acute distress.  He's alert and oriented x4 and appropriate throughout the examination. Cardiopulmonary assessment is negative for acute distress and he exhibits normal effort.  There is decreased skin turgor on exam but mucous membranes appear moist and healthy.  Lab Findings: Lab Results  Component Value Date   WBC 5.0 08/04/2017   HGB 13.2 08/04/2017   HCT 39.3 08/04/2017   MCV 88.3 08/04/2017   PLT 112 (L) 08/04/2017     Radiographic Findings: No results found.  Impression/Plan: 1. 83 y.o.gentleman with stage T2a adenocarcinoma of the prostate with a Gleason score of 4+4 and PSA of 2.4.   He will continue to follow up with urology for ongoing PSA determinations and has an appointment scheduled with Dr. Karsten Ro on 12/02/2017. He understands what to expect with regards to PSA monitoring going forward. I will look forward to following his response to treatment via correspondence with urology, and would be happy to continue to participate in his care if clinically indicated. I talked to the patient about what to expect in the future, including his risk for erectile dysfunction and rectal bleeding. I encouraged him to call or return to the office if he has any  questions regarding his previous radiation or possible radiation side effects. He was comfortable with this plan and will follow up as needed. 2. Hypotension secondary to mild to moderate dehydration.  Advised to increase fluid intake- mainly water but also encouraged to drink Gatorade for electrolyte replacement.  Also encouraged to eat chicken noodle soup and Jell-O which will help with his fluid intake as well.  He will closely monitor his blood pressure and follow-up with his primary care provider should this remain low despite efforts to increase fluid intake or should he develop any symptoms of lightheadedness/dizziness/imbalance. 3. Diarrhea.  This is likely still related to his recent radiation treatments and he is encouraged that this should continue to gradually improve/resolve over time.  In the meantime, he can try using Metamucil mixed with 4 ounces of water to help bulk  stool.  He could also try Pepto-Bismol as needed to slow his bowel movements as well.  He will avoid bowel and bladder irritants and we reviewed those today in the office.    Nicholos Johns, PA-C

## 2017-12-25 ENCOUNTER — Other Ambulatory Visit: Payer: Self-pay | Admitting: Cardiology

## 2018-01-05 ENCOUNTER — Ambulatory Visit (INDEPENDENT_AMBULATORY_CARE_PROVIDER_SITE_OTHER): Payer: Medicare Other | Admitting: Cardiology

## 2018-01-05 ENCOUNTER — Encounter: Payer: Self-pay | Admitting: Cardiology

## 2018-01-05 VITALS — BP 114/62 | HR 53 | Ht 69.0 in | Wt 170.4 lb

## 2018-01-05 DIAGNOSIS — I493 Ventricular premature depolarization: Secondary | ICD-10-CM | POA: Diagnosis not present

## 2018-01-05 DIAGNOSIS — I255 Ischemic cardiomyopathy: Secondary | ICD-10-CM | POA: Diagnosis not present

## 2018-01-05 DIAGNOSIS — I10 Essential (primary) hypertension: Secondary | ICD-10-CM | POA: Diagnosis not present

## 2018-01-05 DIAGNOSIS — I25118 Atherosclerotic heart disease of native coronary artery with other forms of angina pectoris: Secondary | ICD-10-CM | POA: Diagnosis not present

## 2018-01-05 MED ORDER — RANITIDINE HCL 300 MG PO CAPS: 300.0000 mg | ORAL_CAPSULE | Freq: Every evening | ORAL | 3 refills | Status: DC

## 2018-01-05 NOTE — Progress Notes (Signed)
Cardiology Office Note:    Date:  01/05/2018   ID:  Tristan Kennedy, DOB September 22, 1934, MRN [id number]  PCP:  Myrlene Broker, MD  Cardiologist:  Jenne Campus, MD    Referring MD: Myrlene Broker, MD   Chief Complaint  Patient presents with  . Follow-up  Not doing well  History of Present Illness:    Tristan Kennedy is a 83 y.o. male who is been very active all his life he worked a lot however in February he was diagnosed with prostate cancer and started therapy for it since that time he is weak tired exhausted does not have any energy lost appetite does not have any interest in life.  Does not have suicidal ideas.  Past Medical History:  Diagnosis Date  . Anxiety   . Aortic atherosclerosis ([place]) 04/29/2017  . Arthritis   . Cardiomyopathy ([place]) 12/12/2016   EF 50% in July 2012 by echo    . Depression   . Essential hypertension 08/15/2014  . Fatigue   . 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 GERD (gastroesophageal reflux disease)   . History of bradycardia    as low as the 20's  . History of kidney stones   . HLD (hyperlipidemia)   . HTN (hypertension)   . Hyperlipidemia 08/15/2014  . Migraines   . Mild CAD 08/15/2014  . Paroxysmal VT ([place]) 12/12/2016  . Prostate cancer ([place])   . PUD (peptic ulcer disease)   . Sigmoid diverticulosis 04/29/2017   Marked  . Skin cancer    face and back  . Spinal stenosis, lumbar     Past Surgical History:  Procedure Laterality Date  . CARDIAC CATHETERIZATION    . COLONOSCOPY    . CYSTOSCOPY N/A 08/11/2017   Procedure: CYSTOSCOPY;  Surgeon: Kathie Rhodes, MD;  Location: [facility];  Service: Urology;  Laterality: N/A;  NO SEEDS IN BLADDER PER DR OTTELIN  . KIDNEY STONE SURGERY  2000  . PROSTATE BIOPSY  04/08/2017  . RADIOACTIVE SEED IMPLANT N/A 08/11/2017   Procedure: RADIOACTIVE SEED IMPLANT/BRACHYTHERAPY IMPLANT;  Surgeon: Kathie Rhodes, MD;  Location: [facility];  Service: Urology;  Laterality: N/A;  37 SEEDS    . SPACE OAR INSTILLATION N/A 08/11/2017   Procedure: SPACE OAR INSTILLATION;  Surgeon: Kathie Rhodes, MD;  Location: [facility];  Service: Urology;  Laterality: N/A;    Current Medications: Current Meds  Medication Sig  . acebutolol (SECTRAL) 200 MG capsule Take 1 capsule (200 mg total) by mouth daily. He takes it 5 days a week  . aspirin 81 MG tablet Take 81 mg by mouth daily.    Marland Kitchen Bioflavonoid Products (ESTER-C) 500-50 MG TABS Take by mouth daily.    . busPIRone (BUSPAR) 15 MG tablet TAKE 1 TABLET TWICE DAILY  . Calcium Carbonate-Vit D-Min (CALCIUM 1200 PO) Take by mouth. Calcium 1200 slow release  . Cholecalciferol (VITAMIN D3) 125 MCG (5000 UT) TABS Take 1 tablet by mouth daily.  . clonazePAM (KLONOPIN) 0.5 MG tablet Take 1 tablet by mouth 2 (two) times daily as needed.  Marland Kitchen FIBER COMPLETE PO Take by mouth.  Marland Kitchen HYDROcodone-acetaminophen (NORCO) 10-325 MG tablet Take 1-2 tablets by mouth every 4 (four) hours as needed for moderate pain. Maximum dose per 24 hours -8 pills.  . hydrocortisone (ANUSOL-HC) 25 MG suppository Place 1 suppository (25 mg total) rectally 2 (two) times daily.  . magnesium oxide (MAG-OX) 400 MG tablet Take 800 mg by mouth daily.    Marland Kitchen  Multiple Vitamins-Minerals (MULTIVITAMIN WITH MINERALS) tablet Take by mouth.  . Omega-3 Fatty Acids (FISH OIL) 1200 MG CAPS Take by mouth 2 (two) times daily.  . ranitidine (ZANTAC) 300 MG capsule Take 300 mg by mouth every evening.    . sertraline (ZOLOFT) 50 MG tablet Take 150 mg by mouth daily.   . simvastatin (ZOCOR) 40 MG tablet Take 1 tablet (40 mg total) by mouth at bedtime.  Marland Kitchen tiZANidine (ZANAFLEX) 4 MG tablet Take 4 mg by mouth at bedtime.  . traMADol (ULTRAM) 50 MG tablet TAKE ONE TABLET BY MOUTH EVERY TWELVE HOURS AS NEEDED  . traZODone (DESYREL) 50 MG tablet Take 50 mg by mouth at bedtime.     Allergies:   Patient has no known allergies.   Social History   Socioeconomic History  . Marital status:  Married    Spouse name: Not on file  . Number of children: 2  . Years of education: Not on file  . Highest education level: Not on file  Occupational History  . Occupation: maintance  Social Needs  . Financial resource strain: Not on file  . Food insecurity:    Worry: Not on file    Inability: Not on file  . Transportation needs:    Medical: Not on file    Non-medical: Not on file  Tobacco Use  . Smoking status: Never Smoker  . Smokeless tobacco: Never Used  Substance and Sexual Activity  . Alcohol use: Never    Frequency: Never  . Drug use: Never  . Sexual activity: Yes  Lifestyle  . Physical activity:    Days per week: Not on file    Minutes per session: Not on file  . Stress: Not on file  Relationships  . Social connections:    Talks on phone: Not on file    Gets together: Not on file    Attends religious service: Not on file    Active member of club or organization: Not on file    Attends meetings of clubs or organizations: Not on file    Relationship status: Not on file  Other Topics Concern  . Not on file  Social History Narrative  . Not on file     Family History: The patient's family history includes Cancer in his paternal uncle; Diabetes in his unknown relative; Hypertension in his unknown relative; Stroke in his unknown relative. ROS:   Please see the history of present illness.    All 14 point review of systems negative except as described per history of present illness  EKGs/Labs/Other Studies Reviewed:      Recent Labs: 08/04/2017: ALT 20; BUN 25; Creatinine, Ser 1.40; Hemoglobin 13.2; Platelets 112; Potassium 5.1; Sodium 145  Recent Lipid Panel    Component Value Date/Time   CHOL 146 12/16/2016 1543   TRIG 162 (H) 12/16/2016 1543   HDL 48 12/16/2016 1543   CHOLHDL 3.0 12/16/2016 1543   LDLCALC 66 12/16/2016 1543    Physical Exam:    VS:  BP 114/62   Pulse (!) 53   Ht 5\' 9"  (1.753 m)   Wt 170 lb 6.4 oz (77.3 kg)   SpO2 98%   BMI 25.16  kg/m     Wt Readings from Last 3 Encounters:  01/05/18 170 lb 6.4 oz (77.3 kg)  11/19/17 177 lb 3.2 oz (80.4 kg)  08/20/17 187 lb (84.8 kg)     GEN:  Well nourished, well developed in no acute distress HEENT: Normal NECK: No  JVD; No carotid bruits LYMPHATICS: No lymphadenopathy CARDIAC: RRR, no murmurs, no rubs, no gallops RESPIRATORY:  Clear to auscultation without rales, wheezing or rhonchi  ABDOMEN: Soft, non-tender, non-distended MUSCULOSKELETAL:  No edema; No deformity  SKIN: Warm and dry LOWER EXTREMITIES: no swelling NEUROLOGIC:  Alert and oriented x 3 PSYCHIATRIC:  Normal affect   ASSESSMENT:    1. Coronary artery disease of native artery of native heart with stable angina pectoris ([place])   2. Ischemic cardiomyopathy   3. Essential hypertension   4. Frequent PVCs    PLAN:    In order of problems listed above:  1. Coronary artery disease doing well from that point review. 2. History of ischemic cardiomyopathy echocardiogram will be done to assess left ventricular ejection fraction. 3. Essential hypertension blood pressure is low when he happens cut down some of his medications. 4. Frequent PVCs denies having any palpitations. 5. Depression with some anxiety.  Clearly have a problem here and very possible that all the symptoms that he complained is related to this issue I will give him some names of psychiatrist as well as psychotherapist and strongly recommend to see somebody to help with this aspect he is being already on Zoloft as well as some clonazepam but with not much response.  I would like to see him back in my office in about 3 months to see how he does.  We will do echocardiogram to assess left ventricular ejection fraction but I doubt his symptomatology is related to his heart.   Medication Adjustments/Labs and Tests Ordered: Current medicines are reviewed at length with the patient today.  Concerns regarding medicines are outlined above.  No orders of the  defined types were placed in this encounter.  Medication changes: No orders of the defined types were placed in this encounter.   Signed, Park Liter, MD, [facility] 01/05/2018 4:52 PM    [place]

## 2018-01-05 NOTE — Patient Instructions (Signed)
Medication Instructions:  Your physician recommends that you continue on your current medications as directed. Please refer to the Current Medication list given to you today.  If you need a refill on your cardiac medications before your next appointment, please call your pharmacy.   Lab work: None.  If you have labs (blood work) drawn today and your tests are completely normal, you will receive your results only by: . MyChart Message (if you have MyChart) OR . A paper copy in the mail If you have any lab test that is abnormal or we need to change your treatment, we will call you to review the results.  Testing/Procedures: Your physician has requested that you have an echocardiogram. Echocardiography is a painless test that uses sound waves to create images of your heart. It provides your doctor with information about the size and shape of your heart and how well your heart's chambers and valves are working. This procedure takes approximately one hour. There are no restrictions for this procedure.    Follow-Up: At [facility], you and your health needs are our priority.  As part of our continuing mission to provide you with exceptional heart care, we have created designated Provider Care Teams.  These Care Teams include your primary Cardiologist (physician) and Advanced Practice Providers (APPs -  Physician Assistants and Nurse Practitioners) who all work together to provide you with the care you need, when you need it. You will need a follow up appointment in 3 months.  Please call our office 2 months in advance to schedule this appointment.  You may see No primary care provider on file. or another member of our [facility] Provider Team in [place]: Brian Munley, MD . Rajan Revankar, MD  Any Other Special Instructions Will Be Listed Below (If Applicable).   Echocardiogram An echocardiogram is a procedure that uses painless sound waves (ultrasound) to produce an image of the heart.  Images from an echocardiogram can provide important information about:  Signs of coronary artery disease (CAD).  Aneurysm detection. An aneurysm is a weak or damaged part of an artery wall that bulges out from the normal force of blood pumping through the body.  Heart size and shape. Changes in the size or shape of the heart can be associated with certain conditions, including heart failure, aneurysm, and CAD.  Heart muscle function.  Heart valve function.  Signs of a past heart attack.  Fluid buildup around the heart.  Thickening of the heart muscle.  A tumor or infectious growth around the heart valves. Tell a health care provider about:  Any allergies you have.  All medicines you are taking, including vitamins, herbs, eye drops, creams, and over-the-counter medicines.  Any blood disorders you have.  Any surgeries you have had.  Any medical conditions you have.  Whether you are pregnant or may be pregnant. What are the risks? Generally, this is a safe procedure. However, problems may occur, including:  Allergic reaction to dye (contrast) that may be used during the procedure. What happens before the procedure? No specific preparation is needed. You may eat and drink normally. What happens during the procedure?   An IV tube may be inserted into one of your veins.  You may receive contrast through this tube. A contrast is an injection that improves the quality of the pictures from your heart.  A gel will be applied to your chest.  A wand-like tool (transducer) will be moved over your chest. The gel will help to   transmit the sound waves from the transducer.  The sound waves will harmlessly bounce off of your heart to allow the heart images to be captured in real-time motion. The images will be recorded on a computer. The procedure may vary among health care providers and hospitals. What happens after the procedure?  You may return to your normal, everyday life,  including diet, activities, and medicines, unless your health care provider tells you not to do that. Summary  An echocardiogram is a procedure that uses painless sound waves (ultrasound) to produce an image of the heart.  Images from an echocardiogram can provide important information about the size and shape of your heart, heart muscle function, heart valve function, and fluid buildup around your heart.  You do not need to do anything to prepare before this procedure. You may eat and drink normally.  After the echocardiogram is completed, you may return to your normal, everyday life, unless your health care provider tells you not to do that. This information is not intended to replace advice given to you by your health care provider. Make sure you discuss any questions you have with your health care provider. Document Released: 12/29/1999 Document Revised: 02/03/2016 Document Reviewed: 02/03/2016 Elsevier Interactive Patient Education  2019 [employer]    

## 2018-01-21 ENCOUNTER — Other Ambulatory Visit: Payer: Self-pay | Admitting: Cardiology

## 2018-02-18 ENCOUNTER — Ambulatory Visit (INDEPENDENT_AMBULATORY_CARE_PROVIDER_SITE_OTHER): Payer: Medicare Other

## 2018-02-18 DIAGNOSIS — I25118 Atherosclerotic heart disease of native coronary artery with other forms of angina pectoris: Secondary | ICD-10-CM

## 2018-02-18 DIAGNOSIS — I255 Ischemic cardiomyopathy: Secondary | ICD-10-CM | POA: Diagnosis not present

## 2018-02-18 NOTE — Progress Notes (Signed)
Complete echocardiogram has been performed.  Jimmy Lisseth Brazeau RDCS, RVT 

## 2018-02-19 ENCOUNTER — Other Ambulatory Visit: Payer: Self-pay | Admitting: Cardiology

## 2018-03-19 ENCOUNTER — Encounter: Payer: Self-pay | Admitting: Cardiology

## 2018-03-19 ENCOUNTER — Ambulatory Visit (INDEPENDENT_AMBULATORY_CARE_PROVIDER_SITE_OTHER): Payer: Medicare Other | Admitting: Cardiology

## 2018-03-19 VITALS — BP 88/58 | HR 44 | Ht 69.0 in | Wt 166.4 lb

## 2018-03-19 DIAGNOSIS — I959 Hypotension, unspecified: Secondary | ICD-10-CM | POA: Diagnosis not present

## 2018-03-19 DIAGNOSIS — Z1329 Encounter for screening for other suspected endocrine disorder: Secondary | ICD-10-CM

## 2018-03-19 DIAGNOSIS — R001 Bradycardia, unspecified: Secondary | ICD-10-CM

## 2018-03-19 DIAGNOSIS — R5382 Chronic fatigue, unspecified: Secondary | ICD-10-CM | POA: Diagnosis not present

## 2018-03-19 DIAGNOSIS — I493 Ventricular premature depolarization: Secondary | ICD-10-CM

## 2018-03-19 DIAGNOSIS — I420 Dilated cardiomyopathy: Secondary | ICD-10-CM

## 2018-03-19 DIAGNOSIS — R5381 Other malaise: Secondary | ICD-10-CM

## 2018-03-19 MED ORDER — MIDODRINE HCL 5 MG PO TABS: 5.0000 mg | ORAL_TABLET | Freq: Two times a day (BID) | ORAL | 1 refills | Status: DC

## 2018-03-19 NOTE — Progress Notes (Signed)
Cardiology Office Note:    Date:  03/19/2018   ID:  Tristan Kennedy, DOB Aug 23, 1934, MRN [id number]  PCP:  Myrlene Broker, MD  Cardiologist:  Shirlee More, MD    Referring MD: Myrlene Broker, MD    ASSESSMENT:    1. Bradycardia   2. Hypotension, unspecified hypotension type   3. Frequent PVCs   4. Chronic fatigue and malaise   5. Thyroid disorder screening   6. Dilated cardiomyopathy ([place])    PLAN:    In order of problems listed above:  1. Is quite symptomatic today resting heart rate in the low 40s sinus bradycardia having no PVCs we will stop his beta-blocker 2. BP 90 or less I am uncertain mechanism whether it is all due to bradycardia cardiomyopathy or his generalized debilitation and weakness related to his cancer I placed him on ProAmatine 5 mg twice daily we will continue for the next few weeks until results of testing and office follow-up make a decision whether this is needed for symptomatic relief 3. Improved no PVCs on his EKG asymptomatic best to stop his beta blocker with bradycardia and hypotension 4. He is profoundly debilitated weak has lost weight since treatment for cancer I am uncertain of the mechanism and asked him to have routine labs done including TSH cortisol CBC and a CMP panel along with proBNP level.  I do think think the symptoms weight loss are disproportionate to either bradycardia or his cardiomyopathy at this time 5. Check TSH 6. Reassess in the office in 2 weeks check proBNP level this time I cannot see placing him on treatment with either beta-blocker ARNI or MRA.  He has no evidence of overt decompensated heart failure   Next appointment: 2 weeks   Medication Adjustments/Labs and Tests Ordered: Current medicines are reviewed at length with the patient today.  Concerns regarding medicines are outlined above.  Orders Placed This Encounter  Procedures  . CBC  . TSH  . Comp Met (CMET)  . Cortisol  . Pro b natriuretic peptide (BNP)  .  EKG 12-Lead   Meds ordered this encounter  Medications  . midodrine (PROAMATINE) 5 MG tablet    Sig: Take 1 tablet (5 mg total) by mouth 2 (two) times daily with a meal.    Dispense:  60 tablet    Refill:  1    No chief complaint on file.   History of Present Illness:    Tristan Kennedy is a 84 y.o. male with a hx of CAD, Dyslipidemia, HTN, and PVC's   last seen by me 08/20/17. Compliance with diet, lifestyle and medications: Yes  He has had a profound deterioration with marked degree of weight loss loss of appetite weakness depression and has had hypotension and bradycardia.  He was seen by my partner in December echocardiogram showed severe left ventricular dysfunction and comes to see me in follow-up.  His resting heart rate is 44 bpm sinus bradycardia blood pressure initially was less than 90 repeat by me in the left upper extremity 100/70.  He has had hypotension and bradycardia at home I am uncertain of the mechanism we will stop his beta-blocker place him on ProAmatine check comprehensive labs for thyroid and adrenal liver renal CBC proBNP level and reassess in the office in 2 weeks.  Echo 02/18/18:   IMPRESSIONS      1. The left ventricle has moderate-severely reduced systolic function of 16-38%. The cavity size is mildly increased. There is mildly  increased left ventricular wall thickness. Echo evidence of pseudonormalization in diastolic relaxation Elevated left  atrial and left ventricular end-diastolic pressures Left ventricular diffuse hypokinesis.  2. The right ventricle has normal systolic function. The cavity in normal in size. There is no increase in right ventricular wall thickness.  3. Severely dilated left atrial size.  4. The mitral valve is normal in structure Mitral valve regurgitation is moderate by color flow Doppler.  5. The aortic valve is tricuspid. There is mild thickening of the aortic valve.  6. There is mild dilatation of the ascending aorta.  Past  Medical History:  Diagnosis Date  . Anxiety   . Aortic atherosclerosis ([place]) 04/29/2017  . Arthritis   . Cardiomyopathy ([place]) 12/12/2016   EF 50% in July 2012 by echo    . Depression   . Essential hypertension 08/15/2014  . Fatigue   . 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 GERD (gastroesophageal reflux disease)   . History of bradycardia    as low as the 20's  . History of kidney stones   . HLD (hyperlipidemia)   . HTN (hypertension)   . Hyperlipidemia 08/15/2014  . Migraines   . Mild CAD 08/15/2014  . Paroxysmal VT ([place]) 12/12/2016  . Prostate cancer ([place])   . PUD (peptic ulcer disease)   . Sigmoid diverticulosis 04/29/2017   Marked  . Skin cancer    face and back  . Spinal stenosis, lumbar     Past Surgical History:  Procedure Laterality Date  . CARDIAC CATHETERIZATION    . COLONOSCOPY    . CYSTOSCOPY N/A 08/11/2017   Procedure: CYSTOSCOPY;  Surgeon: Kathie Rhodes, MD;  Location: [facility];  Service: Urology;  Laterality: N/A;  NO SEEDS IN BLADDER PER DR OTTELIN  . KIDNEY STONE SURGERY  2000  . PROSTATE BIOPSY  04/08/2017  . RADIOACTIVE SEED IMPLANT N/A 08/11/2017   Procedure: RADIOACTIVE SEED IMPLANT/BRACHYTHERAPY IMPLANT;  Surgeon: Kathie Rhodes, MD;  Location: [facility];  Service: Urology;  Laterality: N/A;  14 SEEDS  . SPACE OAR INSTILLATION N/A 08/11/2017   Procedure: SPACE OAR INSTILLATION;  Surgeon: Kathie Rhodes, MD;  Location: [facility];  Service: Urology;  Laterality: N/A;    Current Medications: Current Meds  Medication Sig  . aspirin 81 MG tablet Take 81 mg by mouth daily.    Marland Kitchen Bioflavonoid Products (ESTER-C) 500-50 MG TABS Take 1 tablet by mouth 2 (two) times daily.   . busPIRone (BUSPAR) 15 MG tablet TAKE 1 TABLET TWICE DAILY  . Calcium Carbonate-Vit D-Min (CALCIUM 1200 PO) Take by mouth. Calcium 1200 slow release  . Cholecalciferol (VITAMIN D3) 125 MCG (5000 UT) TABS Take 1 tablet by mouth daily.  . clonazePAM  (KLONOPIN) 0.5 MG tablet Take 1 tablet by mouth 2 (two) times daily as needed.  . Cyanocobalamin (VITAMIN B-12) 1000 MCG SUBL Place 1,000 mcg under the tongue daily.  Marland Kitchen FIBER COMPLETE PO Take 1 tablet by mouth daily.   . magnesium oxide (MAG-OX) 400 MG tablet Take 800 mg by mouth daily.    . Multiple Vitamins-Minerals (MULTIVITAMIN WITH MINERALS) tablet Take 1 tablet by mouth daily.   . Multiple Vitamins-Minerals (ZINC PO) Take 1 tablet by mouth daily.  . Omega-3 Fatty Acids (FISH OIL) 1200 MG CAPS Take by mouth 2 (two) times daily.  . Probiotic Product (PROBIOTIC DAILY PO) Take 1 tablet by mouth daily.  . sertraline (ZOLOFT) 50 MG tablet TAKE 3 TABLETS BY MOUTH daily  . simvastatin (  ZOCOR) 40 MG tablet TAKE ONE TABLET BY MOUTH AT BEDTIME  . traMADol (ULTRAM) 50 MG tablet TAKE ONE TABLET BY MOUTH EVERY TWELVE HOURS AS NEEDED  . traZODone (DESYREL) 50 MG tablet Take 50 mg by mouth at bedtime.  . [DISCONTINUED] acebutolol (SECTRAL) 200 MG capsule TAKE ONE CAPSULE BY MOUTH EVERY DAY 5 DAYS WEEKLY     Allergies:   Patient has no known allergies.   Social History   Socioeconomic History  . Marital status: Married    Spouse name: Not on file  . Number of children: 2  . Years of education: Not on file  . Highest education level: Not on file  Occupational History  . Occupation: maintance  Social Needs  . Financial resource strain: Not on file  . Food insecurity:    Worry: Not on file    Inability: Not on file  . Transportation needs:    Medical: Not on file    Non-medical: Not on file  Tobacco Use  . Smoking status: Never Smoker  . Smokeless tobacco: Never Used  Substance and Sexual Activity  . Alcohol use: Never    Frequency: Never  . Drug use: Never  . Sexual activity: Yes  Lifestyle  . Physical activity:    Days per week: Not on file    Minutes per session: Not on file  . Stress: Not on file  Relationships  . Social connections:    Talks on phone: Not on file    Gets  together: Not on file    Attends religious service: Not on file    Active member of club or organization: Not on file    Attends meetings of clubs or organizations: Not on file    Relationship status: Not on file  Other Topics Concern  . Not on file  Social History Narrative  . Not on file     Family History: The patient's family history includes Cancer in his paternal uncle; Diabetes in an other family member; Hypertension in an other family member; Stroke in an other family member. ROS:   Please see the history of present illness.    All other systems reviewed and are negative.  EKGs/Labs/Other Studies Reviewed:    The following studies were reviewed today:  EKG:  EKG ordered today and personally reviewed.  The ekg ordered today demonstrates sinus bradycardia otherwise normal  Recent Labs: 08/04/2017: ALT 20; BUN 25; Creatinine, Ser 1.40; Hemoglobin 13.2; Platelets 112; Potassium 5.1; Sodium 145  Recent Lipid Panel    Component Value Date/Time   CHOL 146 12/16/2016 1543   TRIG 162 (H) 12/16/2016 1543   HDL 48 12/16/2016 1543   CHOLHDL 3.0 12/16/2016 1543   LDLCALC 66 12/16/2016 1543    Physical Exam:    VS:  BP (!) 88/58 (BP Location: Right Arm, Patient Position: Sitting, Cuff Size: Normal)   Pulse (!) 44   Ht '5\' 9"'  (1.753 m)   Wt 166 lb 6.4 oz (75.5 kg)   SpO2 94%   BMI 24.57 kg/m     Wt Readings from Last 3 Encounters:  03/19/18 166 lb 6.4 oz (75.5 kg)  01/05/18 170 lb 6.4 oz (77.3 kg)  11/19/17 177 lb 3.2 oz (80.4 kg)     GEN: He looks very weak debilitated depressed tearful in my office dry membranes HEENT: Normal NECK: No JVD; No carotid bruits LYMPHATICS: No lymphadenopathy CARDIAC: RRR, no murmurs, rubs, gallops RESPIRATORY:  Clear to auscultation without rales, wheezing or rhonchi  ABDOMEN: Soft, non-tender, non-distended MUSCULOSKELETAL:  No edema; No deformity  SKIN: Warm and dry NEUROLOGIC:  Alert and oriented x 3 PSYCHIATRIC:  Normal affect     Signed, Shirlee More, MD  03/19/2018 12:56 PM    [place]

## 2018-03-19 NOTE — Patient Instructions (Signed)
Medication Instructions:  Your physician has recommended you make the following change in your medication:   STOP acebutolol  START midodrine (proamatine) 5 mg: Take 1 tablet twice daily   If you need a refill on your cardiac medications before your next appointment, please call your pharmacy.   Lab work: Your physician recommends that you return for lab work today: CBC, CMP, TSH, Cortisol level, ProBNP.   If you have labs (blood work) drawn today and your tests are completely normal, you will receive your results only by: Marland Kitchen MyChart Message (if you have MyChart) OR . A paper copy in the mail If you have any lab test that is abnormal or we need to change your treatment, we will call you to review the results.  Testing/Procedures: You had an EKG today.   Follow-Up: At [facility], you and your health needs are our priority.  As part of our continuing mission to provide you with exceptional heart care, we have created designated Provider Care Teams.  These Care Teams include your primary Cardiologist (physician) and Advanced Practice Providers (APPs -  Physician Assistants and Nurse Practitioners) who all work together to provide you with the care you need, when you need it. You will need a follow up appointment in 2 weeks.      Midodrine tablets What is this medicine? MIDODRINE (MI doe dreen) is used to treat low blood pressure in patients who have symptoms like dizziness when going from a sitting to a standing position. This medicine may be used for other purposes; ask your health care provider or pharmacist if you have questions. COMMON BRAND NAME(S): Orvaten, ProAmatine What should I tell my health care provider before I take this medicine? They need to know if you have any of the following conditions: -difficulty passing urine -heart disease -high blood pressure -kidney disease -over active thyroid -pheochromocytoma -an unusual or allergic reaction to midodrine, other  medicines, foods, dyes, or preservatives -pregnant or trying to get pregnant -breast-feeding How should I use this medicine? Take this medicine by mouth with a glass of water. Follow the directions on the prescription label. The last dose of this medicine should not be taken after the evening meal or less than 4 hours before bedtime. When you lie down for any length of time after taking this medicine, high blood pressure can occur. Do not take this medicine if you will be lying down for any length of time. Do not take your medicine more often than directed. Do not stop taking except on your doctor's advice. Talk to your pediatrician regarding the use of this medicine in children. Special care may be needed. Overdosage: If you think you have taken too much of this medicine contact a poison control center or emergency room at once. NOTE: This medicine is only for you. Do not share this medicine with others. What if I miss a dose? If you miss a dose, take it as soon as you can. If it is almost time for your next dose, take only that dose. Do not take double or extra doses. What may interact with this medicine? Do not take this medicine with any of the following medications: -MAOIs like Carbex, Eldepryl, Marplan, Nardil, and Parnate -medicines called ergot alkaloids -medicines for colds and breathing difficulties or weight loss -procarbazine This medicine may also interact with the following medications: -cimetidine -digoxin -flecainide -fludrocortisone -metformin -procainamide -quinidine -ranitidine -triamterene -medicines called alpha-blockers like doxazosin, prazosin, and terazosin This list may not describe all  possible interactions. Give your health care provider a list of all the medicines, herbs, non-prescription drugs, or dietary supplements you use. Also tell them if you smoke, drink alcohol, or use illegal drugs. Some items may interact with your medicine. What should I watch for  while using this medicine? Visit your doctor or health care professional for regular checks on your progress. You may get drowsy or dizzy. Do not drive, use machinery, or do anything that needs mental alertness until you know how this medicine affects you. Do not stand or sit up quickly, especially if you are an older patient. This reduces the risk of dizzy or fainting spells. Your mouth may get dry. Chewing sugarless gum or sucking hard candy, and drinking plenty of water may help. Contact your doctor if the problem does not go away or is severe. Do not treat yourself for coughs, colds, or pain while you are taking this medicine without asking your doctor or health care professional for advice. Some ingredients may increase your blood pressure. What side effects may I notice from receiving this medicine? Side effects that you should report to your doctor or health care professional as soon as possible: -awareness of heart beating -blurred vision -headache -irregular heartbeat, palpitations, or chest pain -pounding in the ears -skin rash, hives Side effects that usually do not require medical attention (report to your doctor or health care professional if they continue or are bothersome): -change in heart rate -chills -goose bumps -increased need to urinate -itching -stomach pain -tingling in the skin or scalp This list may not describe all possible side effects. Call your doctor for medical advice about side effects. You may report side effects to FDA at 1-800-FDA-1088. Where should I keep my medicine? Keep out of the reach of children. Store at room temperature between 15 and 30 degrees C (59 and 86 degrees F). Throw away any unused medicine after the expiration date. NOTE: This sheet is a summary. It may not cover all possible information. If you have questions about this medicine, talk to your doctor, pharmacist, or health care provider.  2019 Elsevier/Gold Standard (2007-07-20  13:51:24)

## 2018-03-20 LAB — COMPREHENSIVE METABOLIC PANEL
ALT: 23 IU/L (ref 0–44)
AST: 25 IU/L (ref 0–40)
Albumin/Globulin Ratio: 2.3 — ABNORMAL HIGH (ref 1.2–2.2)
Albumin: 4.5 g/dL (ref 3.6–4.6)
Alkaline Phosphatase: 54 IU/L (ref 39–117)
BUN/Creatinine Ratio: 14 (ref 10–24)
BUN: 24 mg/dL (ref 8–27)
Bilirubin Total: 0.8 mg/dL (ref 0.0–1.2)
CHLORIDE: 104 mmol/L (ref 96–106)
CO2: 23 mmol/L (ref 20–29)
Calcium: 9.7 mg/dL (ref 8.6–10.2)
Creatinine, Ser: 1.71 mg/dL — ABNORMAL HIGH (ref 0.76–1.27)
GFR calc Af Amer: 42 mL/min/{1.73_m2} — ABNORMAL LOW (ref >59)
GFR calc non Af Amer: 37 mL/min/{1.73_m2} — ABNORMAL LOW (ref >59)
Globulin, Total: 2 g/dL (ref 1.5–4.5)
Glucose: 87 mg/dL (ref 65–99)
Potassium: 4.8 mmol/L (ref 3.5–5.2)
Sodium: 143 mmol/L (ref 134–144)
Total Protein: 6.5 g/dL (ref 6.0–8.5)

## 2018-03-20 LAB — CBC
Hematocrit: 36.2 % — ABNORMAL LOW (ref 37.5–51.0)
Hemoglobin: 12.1 g/dL — ABNORMAL LOW (ref 13.0–17.7)
MCH: 28.9 pg (ref 26.6–33.0)
MCHC: 33.4 g/dL (ref 31.5–35.7)
MCV: 86 fL (ref 79–97)
Platelets: 157 10*3/uL (ref 150–450)
RBC: 4.19 x10E6/uL (ref 4.14–5.80)
RDW: 14.9 % (ref 11.6–15.4)
WBC: 4.6 10*3/uL (ref 3.4–10.8)

## 2018-03-20 LAB — CORTISOL: Cortisol: 18.8 ug/dL

## 2018-03-20 LAB — PRO B NATRIURETIC PEPTIDE: NT-Pro BNP: 6696 pg/mL — ABNORMAL HIGH (ref 0–486)

## 2018-03-20 LAB — TSH: TSH: 1.51 u[IU]/mL (ref 0.450–4.500)

## 2018-03-25 NOTE — Progress Notes (Signed)
Cardiology Office Note:    Date:  03/26/2018   ID:  Tristan Kennedy, DOB 25-Apr-1934, MRN [id number]  PCP:  Myrlene Broker, MD  Cardiologist:  Shirlee More, MD    Referring MD: Myrlene Broker, MD    ASSESSMENT:    1. Bradycardia   2. Frequent PVCs   3. Hypotension, unspecified hypotension type   4. Essential hypertension   5. Coronary artery disease of native artery of native heart with stable angina pectoris ([place])   6. Dilated cardiomyopathy ([place])   7. Shortness of breath    PLAN:    In order of problems listed above:  1. He is markedly improved heart rate in the 70s blood pressure 338 systolic skin is warm he is outside doing activities like cutting the grass is an entirely different person from the low cardiac output bradycardia hypertensive patient with severe cardiomyopathy present at last meeting.  For now I keep him off beta-blockers do a cardiac CTA to screen for ischemia as the cause of cardiomyopathy and reassess drug therapy at his next visit. 2. Improved asymptomatic 3. Resolved off beta-blocker for now continue his alpha agonist and consider withdrawing at the next visit 4. Improved no longer with symptomatic hypotension 5. I am concerned about severe CAD as cause of cardiomyopathy cardiac CTA will be performed recheck renal function which was worsened her low cardiac output likely improved 6. See above await cardiac CTA if stable at the next visit I will introduce low-dose Entresto and spironolactone 7. Resolved recheck renal function proBNP level   Next appointment: 6 weeks   Medication Adjustments/Labs and Tests Ordered: Current medicines are reviewed at length with the patient today.  Concerns regarding medicines are outlined above.  Orders Placed This Encounter  Procedures  . CT CORONARY MORPH W/CTA COR W/SCORE W/CA W/CM &/OR WO/CM  . CT CORONARY FRACTIONAL FLOW RESERVE DATA PREP  . CT CORONARY FRACTIONAL FLOW RESERVE FLUID ANALYSIS  . Basic  metabolic panel  . Pro b natriuretic peptide (BNP)   Meds ordered this encounter  Medications  . metoprolol tartrate (LOPRESSOR) 50 MG tablet    Sig: Take 1 tablet (50 mg total) by mouth once for 1 dose.    Dispense:  1 tablet    Refill:  0    Chief Complaint  Patient presents with  . Follow-up    for   . Bradycardia  . Hypotension  . Cardiomyopathy    History of Present Illness:    Tristan Kennedy is a 84 y.o. male with a hx of cardiomyopathy with severely reduced ejection fraction hypertensive heart disease frequent PVCs and hypotension last seen 03/19/2018.Marland Kitchen  He was bradycardic his beta-blocker was discontinued and with hypotension is placed on ProAmatine.  His proBNP level was severely elevated at 6969.  Renal function was worsened creatinine 1.7 CBC cortisol TSH levels were normal.  His recent echocardiogram performed 02/18/2018 showed ejection fraction 30 to 35% with left ventricular dilation. Compliance with diet, lifestyle and medications: Yes  He is markedly improved heart rate in the 70s blood pressure greater than 250 systolic no longer week he has been outside working in the garden and has had no edema shortness of breath exercise intolerance chest pain palpitation or syncope his mood and spirits have also normalized Past Medical History:  Diagnosis Date  . Anxiety   . Aortic atherosclerosis ([place]) 04/29/2017  . Arthritis   . Cardiomyopathy ([place]) 12/12/2016   EF 50% in July 2012 by echo    .  Depression   . Essential hypertension 08/15/2014  . Fatigue   . 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 GERD (gastroesophageal reflux disease)   . History of bradycardia    as low as the 20's  . History of kidney stones   . HLD (hyperlipidemia)   . HTN (hypertension)   . Hyperlipidemia 08/15/2014  . Migraines   . Mild CAD 08/15/2014  . Paroxysmal VT ([place]) 12/12/2016  . Prostate cancer ([place])   . PUD (peptic ulcer disease)   . Sigmoid diverticulosis 04/29/2017   Marked  . Skin cancer     face and back  . Spinal stenosis, lumbar     Past Surgical History:  Procedure Laterality Date  . CARDIAC CATHETERIZATION    . COLONOSCOPY    . CYSTOSCOPY N/A 08/11/2017   Procedure: CYSTOSCOPY;  Surgeon: Kathie Rhodes, MD;  Location: [facility];  Service: Urology;  Laterality: N/A;  NO SEEDS IN BLADDER PER DR OTTELIN  . KIDNEY STONE SURGERY  2000  . PROSTATE BIOPSY  04/08/2017  . RADIOACTIVE SEED IMPLANT N/A 08/11/2017   Procedure: RADIOACTIVE SEED IMPLANT/BRACHYTHERAPY IMPLANT;  Surgeon: Kathie Rhodes, MD;  Location: [facility];  Service: Urology;  Laterality: N/A;  86 SEEDS  . SPACE OAR INSTILLATION N/A 08/11/2017   Procedure: SPACE OAR INSTILLATION;  Surgeon: Kathie Rhodes, MD;  Location: [facility];  Service: Urology;  Laterality: N/A;    Current Medications: Current Meds  Medication Sig  . aspirin 81 MG tablet Take 81 mg by mouth daily.    Marland Kitchen Bioflavonoid Products (ESTER-C) 500-50 MG TABS Take 1 tablet by mouth 2 (two) times daily.   . busPIRone (BUSPAR) 15 MG tablet TAKE 1 TABLET TWICE DAILY  . Calcium Carbonate-Vit D-Min (CALCIUM 1200 PO) Take by mouth. Calcium 1200 slow release  . Cholecalciferol (VITAMIN D3) 125 MCG (5000 UT) TABS Take 1 tablet by mouth daily.  . clonazePAM (KLONOPIN) 0.5 MG tablet Take 1 tablet by mouth 2 (two) times daily as needed.  . Cyanocobalamin (VITAMIN B-12) 1000 MCG SUBL Place 1,000 mcg under the tongue daily.  Marland Kitchen FIBER COMPLETE PO Take 1 tablet by mouth daily.   . magnesium oxide (MAG-OX) 400 MG tablet Take 800 mg by mouth daily.    . midodrine (PROAMATINE) 5 MG tablet Take 1 tablet (5 mg total) by mouth 2 (two) times daily with a meal.  . Multiple Vitamins-Minerals (MULTIVITAMIN WITH MINERALS) tablet Take 1 tablet by mouth daily.   . Multiple Vitamins-Minerals (ZINC PO) Take 1 tablet by mouth daily.  . Omega-3 Fatty Acids (FISH OIL) 1200 MG CAPS Take by mouth 2 (two) times daily.  . Probiotic Product  (PROBIOTIC DAILY PO) Take 1 tablet by mouth daily.  . sertraline (ZOLOFT) 50 MG tablet TAKE 3 TABLETS BY MOUTH daily  . simvastatin (ZOCOR) 40 MG tablet TAKE ONE TABLET BY MOUTH AT BEDTIME  . traMADol (ULTRAM) 50 MG tablet TAKE ONE TABLET BY MOUTH EVERY TWELVE HOURS AS NEEDED  . traMADol (ULTRAM) 50 MG tablet Take 50 mg by mouth every 6 (six) hours as needed.  . traZODone (DESYREL) 50 MG tablet Take 50 mg by mouth at bedtime.     Allergies:   Patient has no known allergies.   Social History   Socioeconomic History  . Marital status: Married    Spouse name: Not on file  . Number of children: 2  . Years of education: Not on file  . Highest education level: Not on file  Occupational History  . Occupation: maintance  Social Needs  . Financial resource strain: Not on file  . Food insecurity:    Worry: Not on file    Inability: Not on file  . Transportation needs:    Medical: Not on file    Non-medical: Not on file  Tobacco Use  . Smoking status: Never Smoker  . Smokeless tobacco: Never Used  Substance and Sexual Activity  . Alcohol use: Never    Frequency: Never  . Drug use: Never  . Sexual activity: Yes  Lifestyle  . Physical activity:    Days per week: Not on file    Minutes per session: Not on file  . Stress: Not on file  Relationships  . Social connections:    Talks on phone: Not on file    Gets together: Not on file    Attends religious service: Not on file    Active member of club or organization: Not on file    Attends meetings of clubs or organizations: Not on file    Relationship status: Not on file  Other Topics Concern  . Not on file  Social History Narrative  . Not on file     Family History: The patient's family history includes Cancer in his paternal uncle; Diabetes in an other family member; Hypertension in an other family member; Stroke in an other family member. ROS:   Please see the history of present illness.    All other systems reviewed and  are negative.  EKGs/Labs/Other Studies Reviewed:    The following studies were reviewed today:  Recent Labs: 03/19/2018: ALT 23; BUN 24; Creatinine, Ser 1.71; Hemoglobin 12.1; NT-Pro BNP 6,696; Platelets 157; Potassium 4.8; Sodium 143; TSH 1.510  Recent Lipid Panel    Component Value Date/Time   CHOL 146 12/16/2016 1543   TRIG 162 (H) 12/16/2016 1543   HDL 48 12/16/2016 1543   CHOLHDL 3.0 12/16/2016 1543   LDLCALC 66 12/16/2016 1543    Physical Exam:    VS:  BP 122/78 (BP Location: Left Arm, Patient Position: Sitting, Cuff Size: Normal)   Pulse 70   Ht 5\' 9"  (1.753 m)   Wt 172 lb 9.6 oz (78.3 kg)   SpO2 97%   BMI 25.49 kg/m     Wt Readings from Last 3 Encounters:  03/26/18 172 lb 9.6 oz (78.3 kg)  03/19/18 166 lb 6.4 oz (75.5 kg)  01/05/18 170 lb 6.4 oz (77.3 kg)     GEN:  Well nourished, well developed in no acute distress HEENT: Normal NECK: No JVD; No carotid bruits LYMPHATICS: No lymphadenopathy CARDIAC: RRR, no murmurs, rubs, gallops RESPIRATORY:  Clear to auscultation without rales, wheezing or rhonchi  ABDOMEN: Soft, non-tender, non-distended MUSCULOSKELETAL:  No edema; No deformity  SKIN: Warm and dry NEUROLOGIC:  Alert and oriented x 3 PSYCHIATRIC:  Normal affect    Signed, Shirlee More, MD  03/26/2018 1:09 PM    [facility]

## 2018-03-26 ENCOUNTER — Encounter: Payer: Self-pay | Admitting: Cardiology

## 2018-03-26 ENCOUNTER — Ambulatory Visit (INDEPENDENT_AMBULATORY_CARE_PROVIDER_SITE_OTHER): Payer: Medicare Other | Admitting: Cardiology

## 2018-03-26 ENCOUNTER — Other Ambulatory Visit: Payer: Self-pay

## 2018-03-26 VITALS — BP 122/78 | HR 70 | Ht 69.0 in | Wt 172.6 lb

## 2018-03-26 DIAGNOSIS — I25118 Atherosclerotic heart disease of native coronary artery with other forms of angina pectoris: Secondary | ICD-10-CM

## 2018-03-26 DIAGNOSIS — I959 Hypotension, unspecified: Secondary | ICD-10-CM

## 2018-03-26 DIAGNOSIS — I493 Ventricular premature depolarization: Secondary | ICD-10-CM

## 2018-03-26 DIAGNOSIS — I10 Essential (primary) hypertension: Secondary | ICD-10-CM | POA: Diagnosis not present

## 2018-03-26 DIAGNOSIS — R001 Bradycardia, unspecified: Secondary | ICD-10-CM

## 2018-03-26 DIAGNOSIS — I420 Dilated cardiomyopathy: Secondary | ICD-10-CM

## 2018-03-26 DIAGNOSIS — R0602 Shortness of breath: Secondary | ICD-10-CM

## 2018-03-26 MED ORDER — METOPROLOL TARTRATE 50 MG PO TABS: 50.0000 mg | ORAL_TABLET | Freq: Once | ORAL | 0 refills | Status: DC

## 2018-03-26 NOTE — Patient Instructions (Addendum)
Medication Instructions:  Your physician recommends that you continue on your current medications as directed. Please refer to the Current Medication list given to you today.  If you need a refill on your cardiac medications before your next appointment, please call your pharmacy.   Lab work: Your physician recommends that you return for lab work today: BMP, ProBNP.  If you have labs (blood work) drawn today and your tests are completely normal, you will receive your results only by: Marland Kitchen MyChart Message (if you have MyChart) OR . A paper copy in the mail If you have any lab test that is abnormal or we need to change your treatment, we will call you to review the results.  Testing/Procedures: Your physician has requested that you have cardiac CT. Cardiac computed tomography (CT) is a painless test that uses an x-ray machine to take clear, detailed pictures of your heart. For further information please visit [employer]ugeFiesta[employer]tn. Please follow instruction sheet as given.   Please arrive at the [facility] main entrance of [facility] at xx:xx AM (30-45 minutes prior to test start time)  [facility] [place], [place] [address] [phone]  Proceed to the [facility] Radiology Department (First Floor).  Please follow these instructions carefully (unless otherwise directed):  Hold all erectile dysfunction medications at least 48 hours prior to test.  On the Night Before the Test: . Be sure to Drink plenty of water. . Do not consume any caffeinated/decaffeinated beverages or chocolate 12 hours prior to your test. . Do not take any antihistamines 12 hours prior to your test.  On the Day of the Test: . Drink plenty of water. Do not drink any water within one hour of the test. . Do not eat any food 4 hours prior to the test. . You may take your regular medications prior to the test.  . Take metoprolol (Lopressor) two hours prior to test.                             -If HR is greater than 55 BPM and patient is greater than 13 yrs old Lopressor 50 mg x1.           After the Test: . Drink plenty of water. . After receiving IV contrast, you may experience a mild flushed feeling. This is normal. . On occasion, you may experience a mild rash up to 24 hours after the test. This is not dangerous. If this occurs, you can take Benadryl 25 mg and increase your fluid intake. . If you experience trouble breathing, this can be serious. If it is severe call 911 IMMEDIATELY. If it is mild, please call our office.    Follow-Up: At [facility], you and your health needs are our priority.  As part of our continuing mission to provide you with exceptional heart care, we have created designated Provider Care Teams.  These Care Teams include your primary Cardiologist (physician) and Advanced Practice Providers (APPs -  Physician Assistants and Nurse Practitioners) who all work together to provide you with the care you need, when you need it. You will need a follow up appointment in 4 weeks.        Cardiac CT Angiogram  A cardiac CT angiogram is a procedure to look at the heart and the area around the heart. It may be done to help find the cause of chest pains or other symptoms of heart  disease. During this procedure, a large X-ray machine, called a CT scanner, takes detailed pictures of the heart and the surrounding area after a dye (contrast material) has been injected into blood vessels in the area. The procedure is also sometimes called a coronary CT angiogram, coronary artery scanning, or CTA. A cardiac CT angiogram allows the health care provider to see how well blood is flowing to and from the heart. The health care provider will be able to see if there are any problems, such as:  Blockage or narrowing of the coronary arteries in the heart.  Fluid around the heart.  Signs of weakness or disease in the muscles, valves, and tissues of the  heart. Tell a health care provider about:  Any allergies you have. This is especially important if you have had a previous allergic reaction to contrast dye.  All medicines you are taking, including vitamins, herbs, eye drops, creams, and over-the-counter medicines.  Any blood disorders you have.  Any surgeries you have had.  Any medical conditions you have.  Whether you are pregnant or may be pregnant.  Any anxiety disorders, chronic pain, or other conditions you have that may increase your stress or prevent you from lying still. What are the risks? Generally, this is a safe procedure. However, problems may occur, including:  Bleeding.  Infection.  Allergic reactions to medicines or dyes.  Damage to other structures or organs.  Kidney damage from the dye or contrast that is used.  Increased risk of cancer from radiation exposure. This risk is low. Talk with your health care provider about: ? The risks and benefits of testing. ? How you can receive the lowest dose of radiation. What happens before the procedure?  Wear comfortable clothing and remove any jewelry, glasses, dentures, and hearing aids.  Follow instructions from your health care provider about eating and drinking. This may include: ? For 12 hours before the test - avoid caffeine. This includes tea, coffee, soda, energy drinks, and diet pills. Drink plenty of water or other fluids that do not have caffeine in them. Being well-hydrated can prevent complications. ? For 4-6 hours before the test - stop eating and drinking. The contrast dye can cause nausea, but this is less likely if your stomach is empty.  Ask your health care provider about changing or stopping your regular medicines. This is especially important if you are taking diabetes medicines, blood thinners, or medicines to treat erectile dysfunction. What happens during the procedure?  Hair on your chest may need to be removed so that small sticky patches  called electrodes can be placed on your chest. These will transmit information that helps to monitor your heart during the test.  An IV tube will be inserted into one of your veins.  You might be given a medicine to control your heart rate during the test. This will help to ensure that good images are obtained.  You will be asked to lie on an exam table. This table will slide in and out of the CT machine during the procedure.  Contrast dye will be injected into the IV tube. You might feel warm, or you may get a metallic taste in your mouth.  You will be given a medicine (nitroglycerin) to relax (dilate) the arteries in your heart.  The table that you are lying on will move into the CT machine tunnel for the scan.  The person running the machine will give you instructions while the scans are being done. You  may be asked to: ? Keep your arms above your head. ? Hold your breath. ? Stay very still, even if the table is moving.  When the scanning is complete, you will be moved out of the machine.  The IV tube will be removed. The procedure may vary among health care providers and hospitals. What happens after the procedure?  You might feel warm, or you may get a metallic taste in your mouth from the contrast dye.  You may have a headache from the nitroglycerin.  After the procedure, drink water or other fluids to wash (flush) the contrast material out of your body.  Contact a health care provider if you have any symptoms of allergy to the contrast. These symptoms include: ? Shortness of breath. ? Rash or hives. ? A racing heartbeat.  Most people can return to their normal activities right after the procedure. Ask your health care provider what activities are safe for you.  It is up to you to get the results of your procedure. Ask your health care provider, or the department that is doing the procedure, when your results will be ready. Summary  A cardiac CT angiogram is a procedure  to look at the heart and the area around the heart. It may be done to help find the cause of chest pains or other symptoms of heart disease.  During this procedure, a large X-ray machine, called a CT scanner, takes detailed pictures of the heart and the surrounding area after a dye (contrast material) has been injected into blood vessels in the area.  Ask your health care provider about changing or stopping your regular medicines before the procedure. This is especially important if you are taking diabetes medicines, blood thinners, or medicines to treat erectile dysfunction.  After the procedure, drink water or other fluids to wash (flush) the contrast material out of your body. This information is not intended to replace advice given to you by your health care provider. Make sure you discuss any questions you have with your health care provider. Document Released: 12/14/2007 Document Revised: 11/20/2015 Document Reviewed: 11/20/2015 Elsevier Interactive Patient Education  2019 [employer].

## 2018-03-27 ENCOUNTER — Telehealth: Payer: Self-pay

## 2018-03-27 LAB — PRO B NATRIURETIC PEPTIDE: NT-Pro BNP: 7780 pg/mL — ABNORMAL HIGH (ref 0–486)

## 2018-03-27 MED ORDER — MIDODRINE HCL 5 MG PO TABS: 5.0000 mg | ORAL_TABLET | Freq: Every day | ORAL | 1 refills | Status: DC

## 2018-03-27 NOTE — Telephone Encounter (Signed)
Patient's wife Clara notified of lab results per Dr Bettina Gavia.  Clara advised to reduce midodrine to once daily.  Clara agreed to plan and verbalized understanding.

## 2018-03-27 NOTE — Telephone Encounter (Signed)
-----   Message from Richardo Priest, MD sent at 03/27/2018  7:48 AM EDT ----- Normal or stable result  Lets reduce midodrine to once daily

## 2018-03-31 LAB — BASIC METABOLIC PANEL
BUN / CREAT RATIO: 14 (ref 10–24)
BUN: 18 mg/dL (ref 8–27)
CO2: 22 mmol/L (ref 20–29)
Calcium: 8.8 mg/dL (ref 8.6–10.2)
Chloride: 108 mmol/L — ABNORMAL HIGH (ref 96–106)
Creatinine, Ser: 1.28 mg/dL — ABNORMAL HIGH (ref 0.76–1.27)
GFR calc non Af Amer: 52 mL/min/{1.73_m2} — ABNORMAL LOW (ref >59)
GFR, EST AFRICAN AMERICAN: 60 mL/min/{1.73_m2} (ref >59)
Glucose: 86 mg/dL (ref 65–99)
Potassium: 4.3 mmol/L (ref 3.5–5.2)
Sodium: 145 mmol/L — ABNORMAL HIGH (ref 134–144)

## 2018-03-31 LAB — SPECIMEN STATUS REPORT

## 2018-04-13 ENCOUNTER — Telehealth: Payer: Self-pay

## 2018-04-13 ENCOUNTER — Telehealth: Payer: Self-pay | Admitting: *Deleted

## 2018-04-13 NOTE — Telephone Encounter (Signed)
Pt's wife called to say pt is having chest pain onset 2 weeks off and on, 3 incidents. She says that Elmyra Ricks suggested tums and she says this did help. Wife would like a call back.

## 2018-04-13 NOTE — Telephone Encounter (Signed)
See most recent encounter.  

## 2018-04-13 NOTE — Telephone Encounter (Signed)
Phoned patient to obtain more information. States he has had 3 episodes of chest pain in the last week. All 3 episodes have happened while at rest. He describes the pain as mild and located under the left breast. The pain feels like a string that goes to the bottom of his heart. He denies radiation of pain to arm, neck or jaw. After each episode starts, he has taken 2-3 Tums and the pain has resolved within 20 minutes.    The patient reports checking daily vital signs with systolic blood pressures between 115-120 and heart rates in the 70's. All medications reviewed with patient's wife who fills pill box. States Midodine 5 mg tab is just taken once daily (decreased from twice daily at last appointment).      Patient with history of anxiety and depression, states he has been very anxious lately. He has notified his PCP Dr. [name] who wants to increase his dose of Zoloft and initiate or adjust clonazepam. Patient reports that he's been on Zoloft for 20 years and it doesn't work anymore. He thinks he needs something for his nerves but doesn't want to get addicted. He thinks he needs something to help calm his nerves.

## 2018-04-13 NOTE — Telephone Encounter (Signed)
   Cardiac Questionnaire:    Since your last visit or hospitalization:    1. Have you been having new or worsening chest pain? yes   2. Have you been having new or worsening shortness of breath? yes 3. Have you been having new or worsening leg swelling, wt gain, or increase in abdominal girth (pants fitting more tightly)? no   4. Have you had any passing out spells? no    *A YES to any of these questions would result in the appointment being kept. *If all the answers to these questions are NO, we should indicate that given the current situation regarding the worldwide coronarvirus pandemic, at the recommendation of the CDC, we are looking to limit gatherings in our waiting area, and thus will reschedule their appointment beyond four weeks from today.   _____________   [id number] Pre-Screening Questions:  . Do you currently have a fever? No  (yes = cancel and refer to pcp for e-visit) . Have you recently travelled on a cruise, internationally, or to [place], [place], [place], [place], [place], or [place], [place] [employer]) ? *No  (yes = cancel, stay home, monitor symptoms, and contact pcp or initiate e-visit if symptoms develop) . Have you been in contact with someone that is currently pending confirmation of Covid19 testing or has been confirmed to have the [place] virus?  No  (yes = cancel, stay home, away from tested individual, monitor symptoms, and contact pcp or initiate e-visit if symptoms develop) . Are you currently experiencing fatigue or cough? Chronic fatigue due to cancer treatment (yes = pt should be prepared to have a mask placed at the time of their visit).  Spoke with patient's wife and daughter per DPR to complete screening questions and educate on process of WebEx virtual visit. Patient has been scheduled for a WebEx visit tomorrow 04/14/2018 at 1440. Patient's daughter Kenney Houseman states that she will set up patients' laptop for virtual visit and be ready for call.

## 2018-04-13 NOTE — Telephone Encounter (Signed)
Lets do a televisit they need to use [employer] web[employer]

## 2018-04-14 ENCOUNTER — Emergency Department (HOSPITAL_BASED_OUTPATIENT_CLINIC_OR_DEPARTMENT_OTHER)
Admission: EM | Admit: 2018-04-14 | Discharge: 2018-04-14 | Disposition: A | Discharge status: Home / Self Care | Payer: Medicare Other | Attending: Emergency Medicine | Admitting: Emergency Medicine

## 2018-04-14 ENCOUNTER — Telehealth (INDEPENDENT_AMBULATORY_CARE_PROVIDER_SITE_OTHER): Payer: Medicare Other | Admitting: Cardiology

## 2018-04-14 ENCOUNTER — Other Ambulatory Visit: Payer: Self-pay

## 2018-04-14 ENCOUNTER — Emergency Department (HOSPITAL_BASED_OUTPATIENT_CLINIC_OR_DEPARTMENT_OTHER): Payer: Medicare Other

## 2018-04-14 ENCOUNTER — Encounter (HOSPITAL_BASED_OUTPATIENT_CLINIC_OR_DEPARTMENT_OTHER): Payer: Self-pay | Admitting: *Deleted

## 2018-04-14 ENCOUNTER — Encounter: Payer: Self-pay | Admitting: Cardiology

## 2018-04-14 VITALS — BP 132/91 | HR 86 | Ht 69.0 in | Wt 156.0 lb

## 2018-04-14 DIAGNOSIS — Z85828 Personal history of other malignant neoplasm of skin: Secondary | ICD-10-CM | POA: Insufficient documentation

## 2018-04-14 DIAGNOSIS — I493 Ventricular premature depolarization: Secondary | ICD-10-CM

## 2018-04-14 DIAGNOSIS — I2510 Atherosclerotic heart disease of native coronary artery without angina pectoris: Secondary | ICD-10-CM | POA: Insufficient documentation

## 2018-04-14 DIAGNOSIS — Z79899 Other long term (current) drug therapy: Secondary | ICD-10-CM | POA: Diagnosis not present

## 2018-04-14 DIAGNOSIS — I509 Heart failure, unspecified: Secondary | ICD-10-CM | POA: Insufficient documentation

## 2018-04-14 DIAGNOSIS — R0789 Other chest pain: Secondary | ICD-10-CM | POA: Diagnosis not present

## 2018-04-14 DIAGNOSIS — I110 Hypertensive heart disease with heart failure: Secondary | ICD-10-CM | POA: Insufficient documentation

## 2018-04-14 DIAGNOSIS — Z8546 Personal history of malignant neoplasm of prostate: Secondary | ICD-10-CM | POA: Diagnosis not present

## 2018-04-14 DIAGNOSIS — I420 Dilated cardiomyopathy: Secondary | ICD-10-CM

## 2018-04-14 DIAGNOSIS — Z7982 Long term (current) use of aspirin: Secondary | ICD-10-CM | POA: Diagnosis not present

## 2018-04-14 DIAGNOSIS — I959 Hypotension, unspecified: Secondary | ICD-10-CM | POA: Diagnosis not present

## 2018-04-14 LAB — CBC
HCT: 40.3 % (ref 39.0–52.0)
Hemoglobin: 12.8 g/dL — ABNORMAL LOW (ref 13.0–17.0)
MCH: 28.5 pg (ref 26.0–34.0)
MCHC: 31.8 g/dL (ref 30.0–36.0)
MCV: 89.8 fL (ref 80.0–100.0)
Platelets: 151 10*3/uL (ref 150–400)
RBC: 4.49 MIL/uL (ref 4.22–5.81)
RDW: 15.1 % (ref 11.5–15.5)
WBC: 5.2 10*3/uL (ref 4.0–10.5)
nRBC: 0 % (ref 0.0–0.2)

## 2018-04-14 LAB — MAGNESIUM: Magnesium: 2.3 mg/dL (ref 1.7–2.4)

## 2018-04-14 LAB — TROPONIN I
Troponin I: 0.03 ng/mL (ref <0.03)
Troponin I: 0.03 ng/mL (ref <0.03)

## 2018-04-14 LAB — BASIC METABOLIC PANEL
Anion gap: 9 (ref 5–15)
BUN: 25 mg/dL — ABNORMAL HIGH (ref 8–23)
CHLORIDE: 108 mmol/L (ref 98–111)
CO2: 23 mmol/L (ref 22–32)
Calcium: 9.8 mg/dL (ref 8.9–10.3)
Creatinine, Ser: 1.41 mg/dL — ABNORMAL HIGH (ref 0.61–1.24)
GFR calc Af Amer: 54 mL/min — ABNORMAL LOW (ref >60)
GFR calc non Af Amer: 46 mL/min — ABNORMAL LOW (ref >60)
GLUCOSE: 108 mg/dL — AB (ref 70–99)
Potassium: 4.3 mmol/L (ref 3.5–5.1)
Sodium: 140 mmol/L (ref 135–145)

## 2018-04-14 LAB — HEPATIC FUNCTION PANEL
ALK PHOS: 56 U/L (ref 38–126)
ALT: 14 U/L (ref 0–44)
AST: 21 U/L (ref 15–41)
Albumin: 4.7 g/dL (ref 3.5–5.0)
Bilirubin, Direct: 0.1 mg/dL (ref 0.0–0.2)
Indirect Bilirubin: 0.5 mg/dL (ref 0.3–0.9)
Total Bilirubin: 0.6 mg/dL (ref 0.3–1.2)
Total Protein: 7.8 g/dL (ref 6.5–8.1)

## 2018-04-14 LAB — BRAIN NATRIURETIC PEPTIDE: B Natriuretic Peptide: 1012 pg/mL — ABNORMAL HIGH (ref 0.0–100.0)

## 2018-04-14 MED ORDER — NITROGLYCERIN 0.4 MG SL SUBL: SUBLINGUAL_TABLET | SUBLINGUAL | 3 refills | Status: AC

## 2018-04-14 MED ORDER — SODIUM CHLORIDE 0.9% FLUSH
3.0000 mL | Freq: Once | INTRAVENOUS | Status: DC
  Filled 2018-04-14: qty 3

## 2018-04-14 MED ORDER — FUROSEMIDE 10 MG/ML IJ SOLN
40.0000 mg | INTRAMUSCULAR | Status: AC
  Administered 2018-04-14: 40 mg via INTRAVENOUS
  Filled 2018-04-14: qty 4

## 2018-04-14 NOTE — ED Notes (Signed)
Patient transported to X-ray 

## 2018-04-14 NOTE — ED Triage Notes (Signed)
Pt c/o midsternal CP x 5 days , pt states sent here by PMD, SOb with exertion, states TUms improves pain

## 2018-04-14 NOTE — ED Notes (Signed)
0.03 critical trop reported to Quita Skye, Therapist, sports and EDP

## 2018-04-14 NOTE — Addendum Note (Signed)
Addended by: Austin Miles on: 04/14/2018 04:32 PM   Modules accepted: Orders

## 2018-04-14 NOTE — ED Notes (Signed)
PT states understanding of care given, follow up care. PT ambulated from ED to car with a steady gait.  

## 2018-04-14 NOTE — Progress Notes (Signed)
Virtual Visit via Video Note    Evaluation Performed:  Follow-up visit  This visit type was conducted due to national recommendations for restrictions regarding the COVID-19 Pandemic (e.g. social distancing).  This format is felt to be most appropriate for this patient at this time.  All issues noted in this document were discussed and addressed.  No physical exam was performed (except for noted visual exam findings with Video Visits).  Please refer to the patient's chart (MyChart message for video visits and phone note for telephone visits) for the patient's consent to telehealth for [facility].  Date:  04/14/2018   ID:  Tristan Kennedy, DOB 1934-11-18, MRN [id number]  Patient Location:  Home  Provider location:   [facility]  PCP:  Myrlene Broker, MD  Cardiologist:  No primary care provider on file.  Dr. Bettina Gavia Electrophysiologist:  None   Chief Complaint: Chest pain  History of Present Illness:    Tristan Kennedy is a 84 y.o. male who presents via audio/video conferencing for a telehealth visit today.  He phoned yesterday and complained of brief chest pain. He had been scheduled for a cardiac CTA.  The patient does not have symptoms concerning for COVID-19 infection (fever, chills, cough, or new shortness of breath).   He has a history of mild CAD nonischemic cardiomyopathy with reduced ejection fraction hypertensive heart disease frequent PVCs and hypotension with bradycardia  last seen 03/26/2018 markedly improved off acebutolol and with the initiation of midodrine Initially he was bradycardic his beta-blocker was discontinued and hypotensive.  His proBNP level was severely elevated at 6969.  Renal function was worsened creatinine 1.7 CBC cortisol TSH levels were normal.  His recent echocardiogram performed 02/18/2018 showed ejection fraction 30 to 35% with left ventricular dilation. His repeat BMP showed improved GFR but his BNP remained severely elevated 7780 pro BNP.   Fortunately his daughter was present along with patient and his wife.  There are multiple issues he is very anxious there is some family discord under stress he is called his PCP and has been placed on Wellbutrin.  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  He has mild exertional shortness of breath stable walking outdoors no edema orthopnea.  Yesterday called our office with very vague left chest discomfort nonexertional and resolved after Tums.  Today he had the same symptoms that occurred at rest not typical of esophageal leaf reflux he describes what appears to be a constriction through his chest lasted for about 30 minutes and resolved either spontaneously or after he took an acid again today.  Because of his history of cardiomyopathy recent deterioration known mild CAD after a nice discussion with the patient and his wife and the daughter decided to have him bring him to [place] to be seen in the emergency room.  Presently he is not having chest pain.   Ref Range & Units 2wk ago 3wk ago  NT-Pro BNP 0 - 486 pg/mL 7,780High   6,696High  CM   Prior CV studies:   The following studies were reviewed today:  Echo 02/18/18  1. The left ventricle has moderate-severely reduced systolic function of 73-71%. The cavity size is mildly increased. There is mildly increased left ventricular wall thickness. Echo evidence of pseudonormalization in diastolic relaxation Elevated left  atrial and left ventricular end-diastolic pressures Left ventricular diffuse  hypokinesis.  2. The right ventricle has normal systolic function. The cavity in normal in size. There is no increase in right ventricular wall thickness.  3. Severely dilated left atrial size.  4. The mitral valve is normal in structure Mitral valve regurgitation is moderate by color flow Doppler.  5. The aortic valve is  tricuspid. There is mild thickening of the aortic valve.  6. There is mild dilatation of the ascending aorta.  Past Medical History:  Diagnosis Date  . Anxiety   . Aortic atherosclerosis ([place]) 04/29/2017  . Arthritis   . Cardiomyopathy ([place]) 12/12/2016   EF 50% in July 2012 by echo    . Depression   . Essential hypertension 08/15/2014  . Fatigue   . 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 GERD (gastroesophageal reflux disease)   . History of bradycardia    as low as the 20's  . History of kidney stones   . HLD (hyperlipidemia)   . HTN (hypertension)   . Hyperlipidemia 08/15/2014  . Migraines   . Mild CAD 08/15/2014  . Paroxysmal VT ([place]) 12/12/2016  . Prostate cancer ([place])   . PUD (peptic ulcer disease)   . Sigmoid diverticulosis 04/29/2017   Marked  . Skin cancer    face and back  . Spinal stenosis, lumbar    Past Surgical History:  Procedure Laterality Date  . CARDIAC CATHETERIZATION    . COLONOSCOPY    . CYSTOSCOPY N/A 08/11/2017   Procedure: CYSTOSCOPY;  Surgeon: Kathie Rhodes, MD;  Location: [facility];  Service: Urology;  Laterality: N/A;  NO SEEDS IN BLADDER PER DR OTTELIN  . KIDNEY STONE SURGERY  2000  . PROSTATE BIOPSY  04/08/2017  . RADIOACTIVE SEED IMPLANT N/A 08/11/2017   Procedure: RADIOACTIVE SEED IMPLANT/BRACHYTHERAPY IMPLANT;  Surgeon: Kathie Rhodes, MD;  [facility];  Service: Urology;  Laterality: N/A;  19 SEEDS  . SPACE OAR INSTILLATION N/A 08/11/2017   Procedure: SPACE OAR INSTILLATION;  Surgeon: Kathie Rhodes, MD;  Location: [facility];  Service: Urology;  Laterality: N/A;     No outpatient medications have been marked as taking for the 04/14/18 encounter (Appointment) with Richardo Priest, MD.     Allergies:   Patient has no known allergies.   Social History   Tobacco Use  . Smoking status: Never Smoker  . Smokeless tobacco: Never Used  Substance Use Topics  . Alcohol use: Never    Frequency: Never  .  Drug use: Never     Family Hx: The patient's family history includes Cancer in his paternal uncle; Diabetes in an other family member; Hypertension in an other family member; Stroke in an other family member.  ROS:   Please see the history of present illness.     All other systems reviewed and are negative.   Labs/Other Tests and Data Reviewed:    Recent Labs: 03/19/2018: ALT 23; Hemoglobin 12.1; Platelets 157; TSH 1.510 03/26/2018: BUN 18; Creatinine, Ser 1.28; NT-Pro BNP 7,780; Potassium 4.3; Sodium 145   Recent Lipid Panel Lab Results  Component Value Date/Time   CHOL 146 12/16/2016 03:43 PM   TRIG 162 (H) 12/16/2016 03:43 PM   HDL 48 12/16/2016 03:43 PM   CHOLHDL 3.0 12/16/2016 03:43 PM   LDLCALC 66 12/16/2016 03:43 PM    Wt Readings from Last 3 Encounters:  03/26/18 172 lb 9.6 oz (78.3 kg)  03/19/18 166 lb 6.4 oz (75.5 kg)  01/05/18 170 lb 6.4 oz (77.3 kg)  Objective:    Vital Signs:  There were no vitals taken for this visit.   Chronically ill-appearing male in no acute distress. He had no edema but had moderate neck vein distention and no respiratory distress   ASSESSMENT & PLAN:    1.  Chest pain atypical but quite concerning in the clinical context of deterioration of cardiomyopathy hypotension and known mild CAD from remote coronary angiography in 2000 925% mid LAD stenosis.  Although all this may be related to stress depression I told the daughter think she should bring him to the emergency room for quick evaluation including troponin cardiac enzymes EKG.  If elevated troponin or obvious ischemia on his EKG he require referral for coronary angiography. 2.  Hypotension he has had recurrent episodes of falls and I asked him to put his midodrine again to 5 mg twice daily Dilated cardiomyopathy severe unfortunate this time with CKD bradycardia and hypotension I would be hesitant to put him on MRA beta-blockers or Entresto. Frequent PVCs presently untreated as he  is intolerant of beta-blockers with hypotension.  COVID-19 Education: The signs and symptoms of COVID-19 were discussed with the patient and how to seek care for testing (follow up with PCP or arrange E-visit).  The importance of social distancing was discussed today.  Patient Risk:   After full review of this patient's clinical status, I feel that they are at least moderate risk at this time.  Time:   Today, I have spent 45 minutes with the patient with telehealth technology discussing awaiting chest pain intense family discussion and making a shared decision to seek a higher level of care with quick emergency room evaluation presently he is chest pain-free.     Medication Adjustments/Labs and Tests Ordered: Current medicines are reviewed at length with the patient today.  Concerns regarding medicines are outlined above.  Tests Ordered: No orders of the defined types were placed in this encounter.  Medication Changes: No orders of the defined types were placed in this encounter.   Disposition:  Follow up Will be determined after the ED visit  Signed, Shirlee More, MD  04/14/2018 2:47 PM    [place]

## 2018-04-14 NOTE — ED Notes (Signed)
Pt described to this RN that CP that was described as tightness that happened x2 lasting less than a minute and would go away. Pt reports that it started mid chest and radiated down to abd. Pt states last episode was a week ago. Pt denies associated ShOB, diaphoresis, nausea.

## 2018-04-14 NOTE — ED Provider Notes (Signed)
[facility] EMERGENCY DEPARTMENT Provider Note   CSN: [id number] Arrival date & time: 04/14/18  1632    History   Chief Complaint Chief Complaint  Patient presents with  . Chest Pain    HPI Tristan Kennedy is a 84 y.o. male.     84yo M w/ extensive PMH incluidng CAD, CHF, HTN, HLD, prostate CA who p/w chest pain.  Patient states that approximately 1 week ago when he was walking to his truck, he had a squeezing, twisting sensation in central chest down to bottom of rib cage that was mild in nature and lasted no more than 1 minute after which it spontaneously resolved.  He had another similar episode a few days later that again only lasted 1 minute.  No associated shortness of breath, nausea, vomiting, or diaphoresis.  He has had no other chest pain episodes since that time and he denies any episodes today.  He had some indigestion after he ate lunch but states that that is common for him and it did not involve any chest pain.  He spoke with his cardiologist today who recommended that he come to the ED for evaluation given his chest pain episode a few days ago.  Currently he feels well and denies any complaints.  He states that he has taken his medications today.  He denies any fever, cough/cold symptoms, or recent illness.  He notes some generalized fatigue and weakness that he has had for several months ever since he received treatment for prostate cancer. No LE edema, orthopnea, or PND.  The history is provided by the patient.  Chest Pain    Past Medical History:  Diagnosis Date  . Anxiety   . Aortic atherosclerosis ([place]) 04/29/2017  . Arthritis   . Cardiomyopathy ([place]) 12/12/2016   EF 50% in July 2012 by echo    . Depression   . Essential hypertension 08/15/2014  . Fatigue   . 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 GERD (gastroesophageal reflux disease)   . History of bradycardia    as low as the 20's  . History of kidney stones   . HLD (hyperlipidemia)   . HTN (hypertension)    . Hyperlipidemia 08/15/2014  . Migraines   . Mild CAD 08/15/2014  . Paroxysmal VT ([place]) 12/12/2016  . Prostate cancer ([place])   . PUD (peptic ulcer disease)   . Sigmoid diverticulosis 04/29/2017   Marked  . Skin cancer    face and back  . Spinal stenosis, lumbar     Patient Active Problem List   Diagnosis Date Noted  . Malignant neoplasm of prostate ([place]) 05/29/2017  . Chronic depression 05/11/2017  . GERD without esophagitis 05/11/2017  . Mixed dyslipidemia 05/11/2017  . Asthma 05/09/2017  . At risk for falls 05/09/2017  . Benign headache 05/09/2017  . Chronic fatigue and malaise 05/09/2017  . Degeneration of intervertebral disc of lumbosacral region 05/09/2017  . Elevated PSA, less than 10 ng/ml 05/09/2017  . Hypertensive heart disease without heart failure 05/09/2017  . Medicare annual wellness visit, subsequent 05/09/2017  . Migraine without aura and without status migrainosus, not intractable 05/09/2017  . Panic disorder 05/09/2017  . Pernicious anemia 05/09/2017  . Sinoatrial node dysfunction (H[facility]) 05/09/2017  . Situational anxiety 05/09/2017  . Cardiomyopathy ([place]) 12/12/2016  . Paroxysmal VT ([place]) 12/12/2016  . CAD (coronary artery disease)   . Essential hypertension 08/15/2014  . 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 Hyperlipidemia 08/15/2014  . Mild CAD 08/15/2014    Past  Surgical History:  Procedure Laterality Date  . CARDIAC CATHETERIZATION    . COLONOSCOPY    . CYSTOSCOPY N/A 08/11/2017   Procedure: CYSTOSCOPY;  Surgeon: Kathie Rhodes, MD;  Location: [facility];  Service: Urology;  Laterality: N/A;  NO SEEDS IN BLADDER PER DR OTTELIN  . KIDNEY STONE SURGERY  2000  . PROSTATE BIOPSY  04/08/2017  . RADIOACTIVE SEED IMPLANT N/A 08/11/2017   Procedure: RADIOACTIVE SEED IMPLANT/BRACHYTHERAPY IMPLANT;  Surgeon: Kathie Rhodes, MD;  Location[facility];  Service: Urology;  Laterality: N/A;  63 SEEDS  . SPACE OAR INSTILLATION N/A 08/11/2017    Procedure: SPACE OAR INSTILLATION;  Surgeon: Kathie Rhodes, MD;  Location: [facility];  Service: Urology;  Laterality: N/A;        Home Medications    Prior to Admission medications   Medication Sig Start Date End Date Taking? Authorizing Provider  aspirin 81 MG tablet Take 81 mg by mouth daily.      [provider]  Bioflavonoid Products (ESTER-C) 500-50 MG TABS Take 1 tablet by mouth 2 (two) times daily.     [provider]  buPROPion (WELLBUTRIN SR) 150 MG 12 hr tablet Take 150 mg by mouth 2 (two) times daily. 04/13/18   [provider]  busPIRone (BUSPAR) 15 MG tablet TAKE 1 TABLET TWICE DAILY 10/17/15   [provider]  calcium carbonate (TUMS - DOSED IN MG ELEMENTAL CALCIUM) 500 MG chewable tablet Chew 1 tablet by mouth as needed for indigestion or heartburn.    [provider]  Calcium Carbonate-Vit D-Min (CALCIUM 1200 PO) Take by mouth. Calcium 1200 slow release    [provider]  Cholecalciferol (VITAMIN D3) 125 MCG (5000 UT) TABS Take 1 tablet by mouth daily.    [provider]  clonazePAM (KLONOPIN) 0.5 MG tablet Take 1 tablet by mouth 2 (two) times daily as needed. 10/20/17   [provider]  Cyanocobalamin (VITAMIN B-12) 1000 MCG SUBL Place 1,000 mcg under the tongue daily.    [provider]  FIBER COMPLETE PO Take 1 tablet by mouth daily.     [provider]  magnesium oxide (MAG-OX) 400 MG tablet Take 800 mg by mouth daily.      [provider]  midodrine (PROAMATINE) 5 MG tablet Take 1 tablet (5 mg total) by mouth daily. 03/27/18   Richardo Priest, MD  Multiple Vitamins-Minerals (MULTIVITAMIN WITH MINERALS) tablet Take 1 tablet by mouth daily.     [provider]  Multiple Vitamins-Minerals (ZINC PO) Take 1 tablet by mouth daily.    [provider]  nitroGLYCERIN (NITROSTAT) 0.4 MG SL tablet Take 1 tablet as needed for chest pain. Place tablet  under the tongue and let it dissolve. Take while laying down and remain laying down for 30 minutes after taking it. 04/14/18   Munley, Hilton Cork, MD  Omega-3 Fatty Acids (FISH OIL) 1200 MG CAPS Take by mouth 2 (two) times daily.    [provider]  Probiotic Product (PROBIOTIC DAILY PO) Take 1 tablet by mouth daily.    [provider]  sertraline (ZOLOFT) 50 MG tablet TAKE 3 TABLETS BY MOUTH daily 01/26/18   [provider]  simvastatin (ZOCOR) 40 MG tablet TAKE ONE TABLET BY MOUTH AT BEDTIME 01/22/18   Park Liter, MD  traMADol (ULTRAM) 50 MG tablet Take 50 mg by mouth every 6 (six) hours as needed. 03/26/18   [provider]  traZODone (  DESYREL) 50 MG tablet Take 50 mg by mouth at bedtime.    [provider]    Family History Family History  Problem Relation Age of Onset  . Stroke Other   . Diabetes Other   . Hypertension Other   . Cancer Paternal Uncle        prostate    Social History Social History   Tobacco Use  . Smoking status: Never Smoker  . Smokeless tobacco: Never Used  Substance Use Topics  . Alcohol use: Never    Frequency: Never  . Drug use: Never     Allergies   Patient has no known allergies.   Review of Systems Review of Systems  Cardiovascular: Positive for chest pain.   All other systems reviewed and are negative except that which was mentioned in HPI   Physical Exam Updated Vital Signs BP 133/68   Pulse 88   Temp 98.8 F (37.1 C) (Oral)   Resp 19   SpO2 100%   Physical Exam Vitals signs and nursing note reviewed.  Constitutional:      General: He is not in acute distress.    Appearance: He is well-developed.  HENT:     Head: Normocephalic and atraumatic.     Mouth/Throat:     Mouth: Mucous membranes are moist.     Pharynx: Oropharynx is clear.  Eyes:     Conjunctiva/sclera: Conjunctivae normal.  Neck:     Musculoskeletal: Neck supple.  Cardiovascular:     Rate and Rhythm: Normal rate  and regular rhythm.     Heart sounds: Murmur present. Systolic murmur present.  Pulmonary:     Effort: Pulmonary effort is normal.     Breath sounds: Normal breath sounds.  Abdominal:     General: Bowel sounds are normal. There is no distension.     Palpations: Abdomen is soft.     Tenderness: There is no abdominal tenderness.  Musculoskeletal:     Right lower leg: No edema.     Left lower leg: No edema.  Skin:    General: Skin is warm and dry.  Neurological:     Mental Status: He is alert and oriented to person, place, and time.     Comments: Fluent speech  Psychiatric:        Judgment: Judgment normal.      ED Treatments / Results  Labs (all labs ordered are listed, but only abnormal results are displayed) Labs Reviewed  BASIC METABOLIC PANEL - Abnormal; Notable for the following components:      Result Value   Glucose, Bld 108 (*)    BUN 25 (*)    Creatinine, Ser 1.41 (*)    GFR calc non Af Amer 46 (*)    GFR calc Af Amer 54 (*)    All other components within normal limits  CBC - Abnormal; Notable for the following components:   Hemoglobin 12.8 (*)    All other components within normal limits  TROPONIN I - Abnormal; Notable for the following components:   Troponin I 0.03 (*)    All other components within normal limits  BRAIN NATRIURETIC PEPTIDE - Abnormal; Notable for the following components:   B Natriuretic Peptide 1,012.0 (*)    All other components within normal limits  TROPONIN I - Abnormal; Notable for the following components:   Troponin I 0.03 (*)    All other components within normal limits  HEPATIC FUNCTION PANEL  MAGNESIUM    EKG EKG Interpretation  Date/Time:  Tuesday April 14 2018 17:31:36 EDT Ventricular Rate:  69 PR Interval:    QRS Duration: 133 QT Interval:  454 QTC Calculation: 487 R Axis:   -14 Text Interpretation:  Sinus rhythm Paired ventricular premature complexes Nonspecific intraventricular conduction delay Minimal ST  depression, lateral leads improved PVCs from previous Confirmed by Theotis Burrow [phone]) on 04/14/2018 5:42:47 PM   Radiology Dg Chest 2 View  Result Date: 04/14/2018 CLINICAL DATA:  Intermittent chest pain. EXAM: CHEST - 2 VIEW COMPARISON:  06/26/2017 FINDINGS: The heart size and mediastinal contours are within normal limits. Both lungs are clear. The visualized skeletal structures are unremarkable. IMPRESSION: No active cardiopulmonary disease. Electronically Signed   By: Earle Gell M.D.   On: 04/14/2018 18:10    Procedures Procedures (including critical care time)  Medications Ordered in ED Medications  sodium chloride flush (NS) 0.9 % injection 3 mL (has no administration in time range)  furosemide (LASIX) injection 40 mg (40 mg Intravenous Given 04/14/18 1834)     Initial Impression / Assessment and Plan / ED Course  I have reviewed the triage vital signs and the nursing notes.  Pertinent labs & imaging results that were available during my care of the patient were reviewed by me and considered in my medical decision making (see chart for details).       Comfortable on exam, frequent PVCs on initial EKG. Denies any CP and has not had any in several days. Previous CP episodes 1 min or less in duration. I had spoken with his cardiologist, Dr. Bettina Gavia, who felt that although sx were very atypical for ACS, he recommended at least troponin and basic w/u given extensive history. He advised that if trop negative, pt did not require admission for CP rule out.   Labs show stable Cr 1.4, stable serial trops at 0.03, reassuring CBC. CXR clear. BNP elevated at 1012 however O2 sat 100%, denies SOB, and clear CXR are reassuring against significant volume overload. Gave 1 dose IV lasix here but I am hesitant to start on lasix given recent need for medication adjustments w/ midodrine. I have messaged Dr. Bettina Gavia to advise patient on any further medications he may need. Have counseled the patient on  low sodium diet and continuing all other meds until f/u. Pt admits significant anxiety recently over relationship problems with wife. Have recommended f/u with PCP to discuss his anxiety sx. I have extensively reviewed return precautions with him and he has voiced understanding.  Final Clinical Impressions(s) / ED Diagnoses   Final diagnoses:  Atypical chest pain    ED Discharge Orders    None       Little, Wenda Overland, MD 04/14/18 2109

## 2018-04-16 ENCOUNTER — Telehealth: Payer: Self-pay | Admitting: *Deleted

## 2018-04-16 ENCOUNTER — Other Ambulatory Visit: Payer: Self-pay | Admitting: *Deleted

## 2018-04-16 MED ORDER — PANTOPRAZOLE SODIUM 40 MG PO TBEC: 40.0000 mg | DELAYED_RELEASE_TABLET | Freq: Every day | ORAL | 3 refills | Status: AC

## 2018-04-16 NOTE — Telephone Encounter (Signed)
Patient's follow up virtual visit has been moved up from 04/28/2018 to 04/23/2018 at 1:20 pm per Dr. Bettina Gavia. He will start protonix 40 mg daily to his medication regimen. Prescription has been sent to [facility] as requested. Patient is agreeable to plan and verbalized understanding. No further questions.

## 2018-04-16 NOTE — Telephone Encounter (Signed)
-----   Message from Richardo Priest, MD sent at 04/16/2018 12:38 PM EDT ----- Regarding: RE: Please advise Lets do a virtual 1 week and start protonix 40 mg day #90 refill 3 ----- Message ----- From: Austin Miles, RN Sent: 04/16/2018  11:45 AM EDT To: Richardo Priest, MD Subject: Please advise                                  Dr. Bettina Gavia,  We sent Tristan Kennedy to the ED after his telemedicine visit on Tuesday, 04/14/2018. Patient states that he was instructed before leaving the ED to call our office to schedule a follow up appointment. Please review ED records and advise on when patient should be scheduled for follow up with you.   Also, he states that he discussed with you his swallowing issues during televisit on Tuesday. He reports difficulty swallowing and keeping things down and some esophageal burning in addition to this. He states that you advised for him not to take TUMS and he hasn't but he wants to know if he needs to do anything further to try to help with this problem? He mentioned maybe needing to cut back on the amount of vitamins he takes? Any other medications alternatives? Please advise.  Thanks, Barnetta Chapel, RN

## 2018-04-20 ENCOUNTER — Telehealth: Payer: Self-pay | Admitting: Cardiology

## 2018-04-20 MED ORDER — MIDODRINE HCL 5 MG PO TABS: 5.0000 mg | ORAL_TABLET | Freq: Two times a day (BID) | ORAL | 1 refills | Status: DC

## 2018-04-20 MED ORDER — MIDODRINE HCL 10 MG PO TABS: 10.0000 mg | ORAL_TABLET | Freq: Two times a day (BID) | ORAL | 1 refills | Status: AC

## 2018-04-20 NOTE — Telephone Encounter (Signed)
Patient heart rate is continually going down and weight is going up slightly. No shortness of breath and depressed. No fever Please advise

## 2018-04-20 NOTE — Telephone Encounter (Signed)
Patient called with complaints of symptomatic hypotension. His BP has been 97-104/70-75 and his HR is in the 70's. He has been taking midodrine 5 mg twice daily since his virtual visit on 04/14/2018. Patient reports increased weakness and unsteady gait. He fell yesterday while walking and hit his head. He was not using a cane or walker at the time. Patient is scheduled for a virtual visit with Dr. Bettina Gavia on Thursday, 04/23/2018. Will have Dr. Bettina Gavia advise of further recommendations.

## 2018-04-20 NOTE — Telephone Encounter (Signed)
Increase midodrine to 10 mg bid

## 2018-04-20 NOTE — Telephone Encounter (Signed)
Patient has not had a call back regarding heart rate and weight gain. Please advise.

## 2018-04-20 NOTE — Telephone Encounter (Signed)
Patient's wife Clara notified to increase midodrine to 10mg  twice daily.  Rx sent to pharmacy.  Patient's wife advised to call with any other concerns.  Patient's wife verbalized understanding.

## 2018-04-23 ENCOUNTER — Encounter: Payer: Self-pay | Admitting: Cardiology

## 2018-04-23 ENCOUNTER — Telehealth (INDEPENDENT_AMBULATORY_CARE_PROVIDER_SITE_OTHER): Payer: Medicare Other | Admitting: Cardiology

## 2018-04-23 ENCOUNTER — Other Ambulatory Visit: Payer: Self-pay | Admitting: Cardiology

## 2018-04-23 VITALS — BP 112/77 | HR 50 | Ht 69.0 in | Wt 160.0 lb

## 2018-04-23 DIAGNOSIS — E785 Hyperlipidemia, unspecified: Secondary | ICD-10-CM

## 2018-04-23 DIAGNOSIS — I4729 Other ventricular tachycardia: Secondary | ICD-10-CM

## 2018-04-23 DIAGNOSIS — I493 Ventricular premature depolarization: Secondary | ICD-10-CM

## 2018-04-23 DIAGNOSIS — I950 Idiopathic hypotension: Secondary | ICD-10-CM | POA: Diagnosis not present

## 2018-04-23 DIAGNOSIS — I472 Ventricular tachycardia: Secondary | ICD-10-CM

## 2018-04-23 DIAGNOSIS — I2510 Atherosclerotic heart disease of native coronary artery without angina pectoris: Secondary | ICD-10-CM

## 2018-04-23 DIAGNOSIS — I43 Cardiomyopathy in diseases classified elsewhere: Secondary | ICD-10-CM

## 2018-04-23 DIAGNOSIS — E854 Organ-limited amyloidosis: Secondary | ICD-10-CM

## 2018-04-23 DIAGNOSIS — I420 Dilated cardiomyopathy: Secondary | ICD-10-CM

## 2018-04-23 NOTE — Patient Instructions (Addendum)
Medication Instructions:  Your physician recommends that you continue on your current medications as directed. Please refer to the Current Medication list given to you today.  If you need a refill on your cardiac medications before your next appointment, please call your pharmacy.   Lab work: Your physician recommends that you return for lab work on Monday, 04/27/2018 during office visit: CMP, ProBNP, multiple myeloma panel.   If you have labs (blood work) drawn today and your tests are completely normal, you will receive your results only by: Marland Kitchen MyChart Message (if you have MyChart) OR . A paper copy in the mail If you have any lab test that is abnormal or we need to change your treatment, we will call you to review the results.  Testing/Procedures: Your physician has recommended that you wear a ZIO monitor. ZIO monitors are medical devices that record the heart's electrical activity. Doctors most often use these monitors to diagnose arrhythmias. Arrhythmias are problems with the speed or rhythm of the heartbeat. The monitor is a small, portable device. You can wear one while you do your normal daily activities. This is usually used to diagnose what is causing palpitations/syncope (passing out). Wear for 7 days.   Follow-Up: At [facility], you and your health needs are our priority.  As part of our continuing mission to provide you with exceptional heart care, we have created designated Provider Care Teams.  These Care Teams include your primary Cardiologist (physician) and Advanced Practice Providers (APPs -  Physician Assistants and Nurse Practitioners) who all work together to provide you with the care you need, when you need it. You will need a follow up appointment in 4 days: Monday, 04/27/2018, at in the [place] office.      **Purchase the [facility] app!

## 2018-04-23 NOTE — Addendum Note (Signed)
Addended by: Austin Miles on: 04/23/2018 02:29 PM   Modules accepted: Orders

## 2018-04-23 NOTE — Progress Notes (Signed)
Virtual Visit via Telephone Note   This visit type was conducted due to national recommendations for restrictions regarding the COVID-19 Pandemic (e.g. social distancing) in an effort to limit this patient's exposure and mitigate transmission in our community.  Due to his co-morbid illnesses, this patient is at least at moderate risk for complications without adequate follow up.  This format is felt to be most appropriate for this patient at this time.  The patient did not have access to video technology/had technical difficulties with video requiring transitioning to audio format only (telephone).  All issues noted in this document were discussed and addressed.  No physical exam could be performed with this format.  Please refer to the patient's chart for his  consent to telehealth for [facility].   Evaluation Performed:  Follow-up visit  Date:  04/23/2018   ID:  GAMBLE ENDERLE, DOB 1935/01/09, MRN [id number]  Patient Location: Home  Provider Location: Home  PCP:  Myrlene Broker, MD  Cardiologist:  No primary care provider on file. [facility] [place] Electrophysiologist:  None   Chief Complaint:  Hypotension  History of Present Illness:    CARSON BOGDEN is a 84 y.o. male who presents via Engineer, civil (consulting) for a telehealth visit today.    He has a history of mild non obstructive CAD nonischemic cardiomyopathy with reduced ejection fraction hypertensive heart disease frequent PVCs and hypotension with bradycardia  last seen 03/26/2018 markedly improved off acebutolol and with the initiation of midodrine Initially he was bradycardic his beta-blocker was discontinued and hypotensive.  His proBNP level was severely elevated at 6969.  Renal function was worsened creatinine 1.7 CBC cortisol TSH levels were normal.  His recent echocardiogram performed 02/18/2018 showed ejection fraction 30 to 35% with left ventricular dilation. His repeat BMP showed improved GFR but his BNP remained severely  elevated 7780 pro BNP.    The patient does not have symptoms concerning for COVID-19 infection (fever, chills, cough, or new shortness of breath).   After his last virtual visit I had him assessed in the ED for acute coronary syndrome was discharged home.  He was given a single dose of diuretic in the emergency room with elevated BNP level but was not in overt congestive heart failure.  His EKG did show frequent PVCs and previously he had cardiomyopathy in the setting of ventricular arrhythmia and with suppression of PVCs with beta-blocker markedly improved.  During the week he had blood pressures of 83-382 systolic we increased his midodrine and since then he has been markedly improved he feels well he is not short of breath he has no edema no chest pain palpitation or syncope and his mood and behavior have improved.  Previously there was a great deal of emotional upset anxiety and perhaps depression which is been addressed by his primary care physician.  When seen by me today I asked him to come to our office Monday were going to do an EKG if he still was in ventricular bigeminy abdomen start to suppress his ventricular arrhythmia with amiodarone.  There is a difficult situation he cannot take beta-blockers because of bradycardia he had heart rates in the range of 40 sinus or Entresto because of hypotension.  I did not start him on mL MRA I can recheck his renal function along with liver and thyroid and consider putting on a low-dose of spironolactone.  I will reach out to her heart failure specialist and will discuss whether there is a role for right and  left heart catheterization although I am hesitant to do it at this time with COVID-19 community spread. Past Medical History:  Diagnosis Date   Anxiety    Aortic atherosclerosis ([place]) 04/29/2017   Arthritis    Cardiomyopathy ([place]) 12/12/2016   EF 50% in July 2012 by echo     Depression    Essential hypertension 08/15/2014   Fatigue     Frequent PVCs 08/15/2014   GERD (gastroesophageal reflux disease)    History of bradycardia    as low as the 20's   History of kidney stones    HLD (hyperlipidemia)    HTN (hypertension)    Hyperlipidemia 08/15/2014   Migraines    Mild CAD 08/15/2014   Paroxysmal VT ([place]) 12/12/2016   Prostate cancer ([place])    PUD (peptic ulcer disease)    Sigmoid diverticulosis 04/29/2017   Marked   Skin cancer    face and back   Spinal stenosis, lumbar    Past Surgical History:  Procedure Laterality Date   CARDIAC CATHETERIZATION     COLONOSCOPY     CYSTOSCOPY N/A 08/11/2017   Procedure: CYSTOSCOPY;  Surgeon: Kathie Rhodes, MD;  [facility];  Service: Urology;  Laterality: N/A;  NO SEEDS IN BLADDER PER DR OTTELIN   KIDNEY STONE SURGERY  2000   PROSTATE BIOPSY  04/08/2017   RADIOACTIVE SEED IMPLANT N/A 08/11/2017   Procedure: RADIOACTIVE SEED IMPLANT/BRACHYTHERAPY IMPLANT;  Surgeon: Kathie Rhodes, MD;  Location: [facility];  Service: Urology;  Laterality: N/A;  59 SEEDS   SPACE OAR INSTILLATION N/A 08/11/2017   Procedure: SPACE OAR INSTILLATION;  Surgeon: Kathie Rhodes, MD;  Location: [facility];  Service: Urology;  Laterality: N/A;     No outpatient medications have been marked as taking for the 04/23/18 encounter (Appointment) with Richardo Priest, MD.     Allergies:   Patient has no known allergies.   Social History   Tobacco Use   Smoking status: Never Smoker   Smokeless tobacco: Never Used  Substance Use Topics   Alcohol use: Never    Frequency: Never   Drug use: Never     Family Hx: The patient's family history includes Cancer in his paternal uncle; Diabetes in an other family member; Hypertension in an other family member; Stroke in an other family member.  ROS:   Please see the history of present illness.     All other systems reviewed and are negative.   Prior CV studies:   The following studies  were reviewed today:  IMPRESSIONS   02/18/18  1. The left ventricle has moderate-severely reduced systolic function of 14-78%. The cavity size is mildly increased. There is mildly increased left ventricular wall thickness. Echo evidence of pseudonormalization in diastolic relaxation Elevated left  atrial and left ventricular end-diastolic pressures Left ventricular diffuse hypokinesis.  2. The right ventricle has normal systolic function. The cavity in normal in size. There is no increase in right ventricular wall thickness.  3. Severely dilated left atrial size.  4. The mitral valve is normal in structure Mitral valve regurgitation is moderate by color flow Doppler.  5. The aortic valve is tricuspid. There is mild thickening of the aortic valve.  6. There is mild dilatation of the ascending aorta.  Labs/Other Tests and Data Reviewed:    CXR 04/14/18:   FINDINGS: The heart size and mediastinal contours are within normal limits. Both lungs are clear. The visualized skeletal structures are unremarkable. IMPRESSION: No  active cardiopulmonary disease.  EKG:  An ECG dated 04/14/18 was personally reviewed today and demonstrated:  [facility] PVC's with bigeminy NSIVCD.  Ref Range & Units 9d ago (04/14/18) 9d ago (04/14/18)   Troponin I 0.03 ng/mL 0.03High Panic   0.03High Panic  CM   Recent Labs: 03/19/2018: TSH 1.510 03/26/2018: NT-Pro BNP 7,780 04/14/2018: ALT 14; B Natriuretic Peptide 1,012.0; BUN 25; Creatinine, Ser 1.41; Hemoglobin 12.8; Magnesium 2.3; Platelets 151; Potassium 4.3; Sodium 140   Recent Lipid Panel Lab Results  Component Value Date/Time   CHOL 146 12/16/2016 03:43 PM   TRIG 162 (H) 12/16/2016 03:43 PM   HDL 48 12/16/2016 03:43 PM   CHOLHDL 3.0 12/16/2016 03:43 PM   LDLCALC 66 12/16/2016 03:43 PM    Wt Readings from Last 3 Encounters:  04/14/18 156 lb (70.8 kg)  03/26/18 172 lb 9.6 oz (78.3 kg)  03/19/18 166 lb 6.4 oz (75.5 kg)     Objective:    Vital Signs:  There  were no vitals taken for this visit.   Constitutional, well-nourished well-developed in no acute distress Vital signs reviewed Eyes, conjunctiva and sclera are normal without pallor or icterus extraocular motions intact and normal there is no lid lag Respiratory, normal effort and excursion no audible wheezing without a stethoscope Cardiovascular, no neck vein distention or peripheral edema Skin, no rash skin lesion or ulceration of the extremities Neurologic, cranial nerves II to XII are grossly intact and the patient moves all 4 extremities Neuro/Psychiatric, judgment and thought processes are intact and coherent, alert and oriented x3, mood and affect appear normal. His daughter was present and describes his extremities is cool and dry  ASSESSMENT & PLAN:    1. Hypotension improved continue on his current dose of midodrine he has been evaluated he is not adrenally insufficient.  It is possible to frequent PVCs are causing difficulty in checking blood pressure noninvasively they have ordered a new cuff and they will continue to monitor at home 2. Dilated cardiomyopathy initially occurred in 2009 associated with mild nonobstructive CAD and frequent PVCs.  With suppression of PVCs ACE inhibitor beta-blocker EF normalized and he was asymptomatic until his deterioration recently now has severe left ventricular dysfunction and frequent PVCs.  I think the etiology here is due to tachyarrhythmia with ventricular premature contractions unfortunately beta-blockers are poor choice with hypotension and bradycardia, to bring him to the office check an EKG check baseline labs and plan to start him on amiodarone for suppression provided that by the iPhone adapter to follow his EKG at home. 3. See above check EKG Monday plan to start amiodarone 4. Stable continue a statin 5. Ideally I would like to see him have myocardial perfusion study unfortunately is difficult to arrange at this time on that I discussed  with him a heart failure specialist whether his role in doing right and left heart catheterization to define his hemodynamics although at this time I do not think he needs a left ventricular assist device or inotropes.  COVID-19 Education: The signs and symptoms of COVID-19 were discussed with the patient and how to seek care for testing (follow up with PCP or arrange E-visit).  The importance of social distancing was discussed today.  Time:   Today, I have spent 35 minutes with the patient with telehealth technology discussing the above problems.     Medication Adjustments/Labs and Tests Ordered: Current medicines are reviewed at length with the patient today.  Concerns regarding medicines are outlined above.  Tests  Ordered: No orders of the defined types were placed in this encounter.  Medication Changes: No orders of the defined types were placed in this encounter.   Disposition:  Follow up office Monday  Signed, Shirlee More, MD  04/23/2018 1:03 PM    [place]

## 2018-04-27 ENCOUNTER — Ambulatory Visit (INDEPENDENT_AMBULATORY_CARE_PROVIDER_SITE_OTHER): Payer: Medicare Other | Admitting: Cardiology

## 2018-04-27 ENCOUNTER — Other Ambulatory Visit: Payer: Self-pay

## 2018-04-27 ENCOUNTER — Ambulatory Visit (INDEPENDENT_AMBULATORY_CARE_PROVIDER_SITE_OTHER): Payer: Medicare Other

## 2018-04-27 ENCOUNTER — Telehealth: Payer: Self-pay | Admitting: Cardiology

## 2018-04-27 VITALS — BP 112/70 | HR 90

## 2018-04-27 DIAGNOSIS — I5022 Chronic systolic (congestive) heart failure: Secondary | ICD-10-CM

## 2018-04-27 DIAGNOSIS — I493 Ventricular premature depolarization: Secondary | ICD-10-CM

## 2018-04-27 DIAGNOSIS — I420 Dilated cardiomyopathy: Secondary | ICD-10-CM | POA: Diagnosis not present

## 2018-04-27 DIAGNOSIS — I2510 Atherosclerotic heart disease of native coronary artery without angina pectoris: Secondary | ICD-10-CM

## 2018-04-27 MED ORDER — PRAVASTATIN SODIUM 20 MG PO TABS: 20.0000 mg | ORAL_TABLET | Freq: Every day | ORAL | 1 refills | Status: DC

## 2018-04-27 MED ORDER — FUROSEMIDE 20 MG PO TABS: 20.0000 mg | ORAL_TABLET | ORAL | 3 refills | Status: DC

## 2018-04-27 MED ORDER — AMIODARONE HCL 200 MG PO TABS: 200.0000 mg | ORAL_TABLET | Freq: Every day | ORAL | 2 refills | Status: DC

## 2018-04-27 MED ORDER — PRAVASTATIN SODIUM 20 MG PO TABS: 20.0000 mg | ORAL_TABLET | Freq: Every day | ORAL | 2 refills | Status: DC

## 2018-04-27 NOTE — Telephone Encounter (Signed)
Received call from [facility] drug pharmacist [place] to notify prescriber of drug interaction between simvastatin and amiodarone, potentiation of amiodarone dose.  pls advise, tx!

## 2018-04-27 NOTE — Telephone Encounter (Signed)
Please call [facility] drug at [phone] about drug interaction between amiodarone and simvastatin

## 2018-04-27 NOTE — Telephone Encounter (Signed)
DC simvastatin, new pravastatin 20 mg day 90 days refill 2

## 2018-04-27 NOTE — Progress Notes (Signed)
Cardiology Office Note:    Date:  04/27/2018   ID:  Tristan Kennedy, DOB 1934-06-30, MRN [id number]  PCP:  Tristan Broker, MD  Cardiologist:  Tristan More, MD    Referring MD: Tristan Broker, MD    ASSESSMENT:    1. Dilated cardiomyopathy ([place])   2. Frequent PVCs   3. Mild CAD   4. Chronic systolic heart failure (HCC)    PLAN:    In order of problems listed above:  1. Myocardial perfusion study to screen for CAD.  Unless high risk I would not refer for heart catheterization at the present time.  Unfortunately renal insufficiency hypotension and bradycardia precludes standard drug therapy for heart failure.  I discussed the case with Tristan Kennedy.  I reviewed his echocardiogram does not seem consistent with amyloidosis 2. Initiate amiodarone in view of his severe cardiomyopathy clinically in the past he had frequent PVC induced cardiomyopathy cannot tolerate beta-blockers with hypotension bradycardia and will start low-dose amiodarone as an outpatient.  Check baseline labs including renal function liver function thyroid proBNP and myeloma panel 3. Continue medical therapy including aspirin statin reluctantly check myocardial perfusion study 4. Decompensated start a minimum dose of a diuretic continue to restrict sodium monitor weights at home   Next appointment: 4 weeks   Medication Adjustments/Labs and Tests Ordered: Current medicines are reviewed at length with the patient today.  Concerns regarding medicines are outlined above.  Orders Placed This Encounter  Procedures  . Myocardial Perfusion Imaging   Meds ordered this encounter  Medications  . amiodarone (PACERONE) 200 MG tablet    Sig: Take 1 tablet (200 mg total) by mouth daily.    Dispense:  90 tablet    Refill:  2  . furosemide (LASIX) 20 MG tablet    Sig: Take 1 tablet (20 mg total) by mouth every other day.    Dispense:  90 tablet    Refill:  3    Chief Complaint  Patient presents with  . Follow-up     after 2 virtual visits  . Cardiomyopathy    frequent PVC's  . Hypotension    History of Present Illness:    Tristan Kennedy is a 84 y.o. male with a hx of He has a history of mild CAD nonischemic cardiomyopathy with reduced ejection fraction hypertensive heart disease frequent PVCs and hypotension with bradycardia  last seen 04/23/18 improved off acebutolol and with the initiation of midodrine Initially he was bradycardic his beta-blocker was discontinued and hypotensive.  His proBNP level was severely elevated at 6969.  Renal function was worsened creatinine 1.7 CBC cortisol TSH levels were normal.  His recent echocardiogram performed 02/18/2018 showed ejection fraction 30 to 35% with left ventricular dilation. His repeat BMP showed improved GFR but his BNP remained severely elevated 7780 pro BNP.  He was seen in the emergency [place] service 04/14/2018 after repeated hypotension and vague chest discomfort.  He did not have acute coronary syndrome he has static low level troponin he received IV Lasix and was discharged from the hospital.  Since then he is improved we increase the dose of his m9idodrinewith again symptomatic hypotension I reviewed the ED records I noticed that he was in bigeminy.  Previously his cardiomyopathy occurred in the setting of frequent ventricular arrhythmia.  I spoke with our heart failure colleague Tristan Kennedy on bring him to the office today place a ZIO monitor and start him on low-dose amiodarone.  Unfortunately  I am unable to start medical therapy MRA beta-blocker or Entresto because of hypotension.  I reviewed his echocardiogram and it did not suggest amyloidosis however we will check the labs myeloma panel today. Compliance with diet, lifestyle and medications: Yes  He presents today in particular as he has had an ER into virtual visits and has frequent PVCs and cardiomyopathy.  Hypotension precludes standard drug therapy and he is on a higher  dose of an alpha agonist.  Overall he still feels badly with weakness short of breath climbing stairs but no edema orthopnea chest pain palpitation or syncope.  His weight is up 2 to 4 pounds and he has trace edema.  After discussion of benefits risk and options will start amiodarone as he may have cardiomyopathy due to frequent ventricular ectopy we will apply an extended ZIO monitor and place him on a minimum dose of diuretic furosemide 20 mg 3 days/week.  Reluctantly I am going to send him for myocardial perfusion study to screen him for ischemia and CAD.  He will be seen back by me in the office in 4 weeks.  He has had no fever cough or worsening shortness of breath.  He has had no exposure to COVID-19 Past Medical History:  Diagnosis Date  . Anxiety   . Aortic atherosclerosis ([place]) 04/29/2017  . Arthritis   . Cardiomyopathy ([place]) 12/12/2016   EF 50% in July 2012 by echo    . Depression   . Essential hypertension 08/15/2014  . Fatigue   . 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 GERD (gastroesophageal reflux disease)   . History of bradycardia    as low as the 20's  . History of kidney stones   . HLD (hyperlipidemia)   . HTN (hypertension)   . Hyperlipidemia 08/15/2014  . Migraines   . Mild CAD 08/15/2014  . Paroxysmal VT ([place]) 12/12/2016  . Prostate cancer ([place])   . PUD (peptic ulcer disease)   . Sigmoid diverticulosis 04/29/2017   Marked  . Skin cancer    face and back  . Spinal stenosis, lumbar     Past Surgical History:  Procedure Laterality Date  . CARDIAC CATHETERIZATION    . COLONOSCOPY    . CYSTOSCOPY N/A 08/11/2017   Procedure: CYSTOSCOPY;  Surgeon: Tristan Rhodes, MD;  Location[facility];  Service: Urology;  Laterality: N/A;  NO SEEDS IN BLADDER PER DR OTTELIN  . KIDNEY STONE SURGERY  2000  . PROSTATE BIOPSY  04/08/2017  . RADIOACTIVE SEED IMPLANT N/A 08/11/2017   Procedure: RADIOACTIVE SEED IMPLANT/BRACHYTHERAPY IMPLANT;  Surgeon: Tristan Rhodes, MD;  [facility]  Service: Urology;  Laterality: N/A;  66 SEEDS  . SPACE OAR INSTILLATION N/A 08/11/2017   Procedure: SPACE OAR INSTILLATION;  Surgeon: Tristan Rhodes, MD;  Location: [facility];  Service: Urology;  Laterality: N/A;    Current Medications: Current Meds  Medication Sig  . aspirin 81 MG tablet Take 81 mg by mouth daily.    Marland Kitchen Bioflavonoid Products (ESTER-C) 500-50 MG TABS Take 1 tablet by mouth 2 (two) times daily.   Marland Kitchen buPROPion (WELLBUTRIN SR) 150 MG 12 hr tablet Take 150 mg by mouth 2 (two) times daily.  . busPIRone (BUSPAR) 15 MG tablet TAKE 1 TABLET TWICE DAILY  . calcium carbonate (TUMS - DOSED IN MG ELEMENTAL CALCIUM) 500 MG chewable tablet Chew 1 tablet by mouth as needed for indigestion or heartburn.  . Calcium Carbonate-Vit D-Min (CALCIUM 1200 PO) Take by  mouth. Calcium 1200 slow release  . Cholecalciferol (VITAMIN D3) 125 MCG (5000 UT) TABS Take 1 tablet by mouth daily.  . clonazePAM (KLONOPIN) 0.5 MG tablet Take 1 tablet by mouth 2 (two) times daily as needed.  . Cyanocobalamin (VITAMIN B-12) 1000 MCG SUBL Place 1,000 mcg under the tongue daily.  Marland Kitchen FIBER COMPLETE PO Take 1 tablet by mouth daily.   . magnesium oxide (MAG-OX) 400 MG tablet Take 800 mg by mouth daily.    . midodrine (PROAMATINE) 10 MG tablet Take 1 tablet (10 mg total) by mouth 2 (two) times daily with a meal.  . Multiple Vitamins-Minerals (MULTIVITAMIN WITH MINERALS) tablet Take 1 tablet by mouth daily.   . Multiple Vitamins-Minerals (ZINC PO) Take 1 tablet by mouth daily.  . nitroGLYCERIN (NITROSTAT) 0.4 MG SL tablet Take 1 tablet as needed for chest pain. Place tablet under the tongue and let it dissolve. Take while laying down and remain laying down for 30 minutes after taking it.  . Omega-3 Fatty Acids (FISH OIL) 1200 MG CAPS Take by mouth 2 (two) times daily.  . pantoprazole (PROTONIX) 40 MG tablet Take 1 tablet (40 mg total) by mouth daily.  . Probiotic Product (PROBIOTIC DAILY  PO) Take 1 tablet by mouth daily.  . sertraline (ZOLOFT) 50 MG tablet TAKE 3 TABLETS BY MOUTH daily  . simvastatin (ZOCOR) 40 MG tablet TAKE ONE TABLET BY MOUTH AT BEDTIME  . traMADol (ULTRAM) 50 MG tablet Take 50 mg by mouth every 6 (six) hours as needed.  . traZODone (DESYREL) 50 MG tablet Take 50 mg by mouth at bedtime.     Allergies:   Patient has no known allergies.   Social History   Socioeconomic History  . Marital status: Married    Spouse name: Not on file  . Number of children: 2  . Years of education: Not on file  . Highest education level: Not on file  Occupational History  . Occupation: maintance  Social Needs  . Financial resource strain: Not on file  . Food insecurity:    Worry: Not on file    Inability: Not on file  . Transportation needs:    Medical: Not on file    Non-medical: Not on file  Tobacco Use  . Smoking status: Never Smoker  . Smokeless tobacco: Never Used  Substance and Sexual Activity  . Alcohol use: Never    Frequency: Never  . Drug use: Never  . Sexual activity: Yes  Lifestyle  . Physical activity:    Days per week: Not on file    Minutes per session: Not on file  . Stress: Not on file  Relationships  . Social connections:    Talks on phone: Not on file    Gets together: Not on file    Attends religious service: Not on file    Active member of club or organization: Not on file    Attends meetings of clubs or organizations: Not on file    Relationship status: Not on file  Other Topics Concern  . Not on file  Social History Narrative  . Not on file     Family History: The patient's family history includes Cancer in his paternal uncle; Diabetes in an other family member; Hypertension in an other family member; Stroke in an other family member. ROS:   Please see the history of present illness.    All other systems reviewed and are negative.  EKGs/Labs/Other Studies Reviewed:    The following  studies were reviewed today:  EKG:   EKG ordered today and personally reviewed.  The ekg ordered today demonstrates.  Sinus rhythm frequent PVCs and couplets nonspecific QRS to my  Recent Labs: 03/19/2018: TSH 1.510 03/26/2018: NT-Pro BNP 7,780 04/14/2018: ALT 14; B Natriuretic Peptide 1,012.0; BUN 25; Creatinine, Ser 1.41; Hemoglobin 12.8; Magnesium 2.3; Platelets 151; Potassium 4.3; Sodium 140  Recent Lipid Panel    Component Value Date/Time   CHOL 146 12/16/2016 1543   TRIG 162 (H) 12/16/2016 1543   HDL 48 12/16/2016 1543   CHOLHDL 3.0 12/16/2016 1543   LDLCALC 66 12/16/2016 1543    Physical Exam:    VS:  BP 112/70 (BP Location: Right Arm, Patient Position: Sitting, Cuff Size: Normal)   Pulse 90     Wt Readings from Last 3 Encounters:  04/23/18 160 lb (72.6 kg)  04/14/18 156 lb (70.8 kg)  03/26/18 172 lb 9.6 oz (78.3 kg)     GEN: He does not look acutely ill but he has lost weight and muscle in the last 6 months in no acute distress HEENT: Normal NECK: No JVD; No carotid bruits LYMPHATICS: No lymphadenopathy CARDIAC: S1 is soft no S3 or murmur RRR, no murmurs, rubs, gallops RESPIRATORY:  Clear to auscultation without rales, wheezing or rhonchi  ABDOMEN: Soft, non-tender, non-distended MUSCULOSKELETAL: 1+ bilateral knee lower calf bilateral edema; No deformity  SKIN: Warm and dry NEUROLOGIC:  Alert and oriented x 3 PSYCHIATRIC:  Normal affect    Signed, Tristan More, MD  04/27/2018 9:01 AM    [place]

## 2018-04-27 NOTE — Addendum Note (Signed)
Addended by: Stevan Born on: 04/27/2018 02:35 PM   Modules accepted: Orders

## 2018-04-27 NOTE — Telephone Encounter (Signed)
Simvastatin dc'd in chart, new script for pravastatin sent electronically to [facility]. Patient and wife called and explained change in medications. Both verbalized understanding of starting pravastatin/dosage/frequency and stopping simva statin.

## 2018-04-27 NOTE — Patient Instructions (Signed)
Medication Instructions:  Your physician has recommended you make the following change in your medication:  START furosemide 20 mg (1 tablet) once daily  START amiodarone 200 mg (1 tablet) once daily  If you need a refill on your cardiac medications before your next appointment, please call your pharmacy.   Lab work: Your physician recommends that you have labs drawn today.  If you have labs (blood work) drawn today and your tests are completely normal, you will receive your results only by: Marland Kitchen MyChart Message (if you have MyChart) OR . A paper copy in the mail If you have any lab test that is abnormal or we need to change your treatment, we will call you to review the results.  Testing/Procedures: Your physician has requested that you have a lexiscan myoview. For further information please visit [email or web address]. Please follow instruction sheet, as given.    Follow-Up: At [facility], you and your health needs are our priority.  As part of our continuing mission to provide you with exceptional heart care, we have created designated Provider Care Teams.  These Care Teams include your primary Cardiologist (physician) and Advanced Practice Providers (APPs -  Physician Assistants and Nurse Practitioners) who all work together to provide you with the care you need, when you need it. .   Any Other Special Instructions Will Be Listed Below  Regadenoson injection What is this medicine? REGADENOSON is used to test the heart for coronary artery disease. It is used in patients who can not exercise for their stress test. This medicine may be used for other purposes; ask your health care provider or pharmacist if you have questions. COMMON BRAND NAME(S): Lexiscan What should I tell my health care provider before I take this medicine? They need to know if you have any of these conditions: -heart problems -lung or breathing disease, like asthma or COPD -an unusual or allergic reaction to  regadenoson, other medicines, foods, dyes, or preservatives -pregnant or trying to get pregnant -breast-feeding How should I use this medicine? This medicine is for injection into a vein. It is given by a health care professional in a hospital or clinic setting. Talk to your pediatrician regarding the use of this medicine in children. Special care may be needed. Overdosage: If you think you have taken too much of this medicine contact a poison control center or emergency room at once. NOTE: This medicine is only for you. Do not share this medicine with others. What if I miss a dose? This does not apply. What may interact with this medicine? -caffeine -dipyridamole -guarana -theophylline This list may not describe all possible interactions. Give your health care provider a list of all the medicines, herbs, non-prescription drugs, or dietary supplements you use. Also tell them if you smoke, drink alcohol, or use illegal drugs. Some items may interact with your medicine. What should I watch for while using this medicine? Your condition will be monitored carefully while you are receiving this medicine. Do not take medicines, foods, or drinks with caffeine (like coffee, tea, or colas) for at least 12 hours before your test. If you do not know if something contains caffeine, ask your health care professional. What side effects may I notice from receiving this medicine? Side effects that you should report to your doctor or health care professional as soon as possible: -allergic reactions like skin rash, itching or hives, swelling of the face, lips, or tongue -breathing problems -chest pain, tightness or palpitations -severe headache Side effects that  usually do not require medical attention (report to your doctor or health care professional if they continue or are bothersome): -flushing -headache -irritation or pain at site where injected -nausea, vomiting This list may not describe all  possible side effects. Call your doctor for medical advice about side effects. You may report side effects to FDA at 1-800-FDA-1088. Where should I keep my medicine? This drug is given in a hospital or clinic and will not be stored at home. NOTE: This sheet is a summary. It may not cover all possible information. If you have questions about this medicine, talk to your doctor, pharmacist, or health care provider.  2019 Elsevier/Gold Standard (2007-08-31 15:08:13)   Cardiac Nuclear Scan A cardiac nuclear scan is a test that is done to check the flow of blood to your heart. It is done when you are resting and when you are exercising. The test looks for problems such as:  Not enough blood reaching a portion of the heart.  The heart muscle not working as it should. You may need this test if:  You have heart disease.  You have had lab results that are not normal.  You have had heart surgery or a balloon procedure to open up blocked arteries (angioplasty).  You have chest pain.  You have shortness of breath. In this test, a special dye (tracer) is put into your bloodstream. The tracer will travel to your heart. A camera will then take pictures of your heart to see how the tracer moves through your heart. This test is usually done at a hospital and takes 2-4 hours. Tell a doctor about:  Any allergies you have.  All medicines you are taking, including vitamins, herbs, eye drops, creams, and over-the-counter medicines.  Any problems you or family members have had with anesthetic medicines.  Any blood disorders you have.  Any surgeries you have had.  Any medical conditions you have.  Whether you are pregnant or may be pregnant. What are the risks? Generally, this is a safe test. However, problems may occur, such as:  Serious chest pain and heart attack. This is only a risk if the stress portion of the test is done.  Rapid heartbeat.  A feeling of warmth in your chest. This  feeling usually does not last long.  Allergic reaction to the tracer. What happens before the test?  Ask your doctor about changing or stopping your normal medicines. This is important.  Follow instructions from your doctor about what you cannot eat or drink.  Remove your jewelry on the day of the test. What happens during the test?  An IV tube will be inserted into one of your veins.  Your doctor will give you a small amount of tracer through the IV tube.  You will wait for 20-40 minutes while the tracer moves through your bloodstream.  Your heart will be monitored with an electrocardiogram (ECG).  You will lie down on an exam table.  Pictures of your heart will be taken for about 15-20 minutes.  You may also have a stress test. For this test, one of these things may be done: ? You will be asked to exercise on a treadmill or a stationary bike. ? You will be given medicines that will make your heart work harder. This is done if you are unable to exercise.  When blood flow to your heart has peaked, a tracer will again be given through the IV tube.  After 20-40 minutes, you will get back  on the exam table. More pictures will be taken of your heart.  Depending on the tracer that is used, more pictures may need to be taken 3-4 hours later.  Your IV tube will be removed when the test is over. The test may vary among doctors and hospitals. What happens after the test?  Ask your doctor: ? Whether you can return to your normal schedule, including diet, activities, and medicines. ? Whether you should drink more fluids. This will help to remove the tracer from your body. Drink enough fluid to keep your pee (urine) pale yellow.  Ask your doctor, or the department that is doing the test: ? When will my results be ready? ? How will I get my results? Summary  A cardiac nuclear scan is a test that is done to check the flow of blood to your heart.  Tell your doctor whether you are  pregnant or may be pregnant.  Before the test, ask your doctor about changing or stopping your normal medicines. This is important.  Ask your doctor whether you can return to your normal activities. You may be asked to drink more fluids. This information is not intended to replace advice given to you by your health care provider. Make sure you discuss any questions you have with your health care provider. Document Released: 06/16/2017 Document Revised: 06/16/2017 Document Reviewed: 06/16/2017 Elsevier Interactive Patient Education  2019 [employer].

## 2018-04-28 ENCOUNTER — Ambulatory Visit (HOSPITAL_COMMUNITY): Payer: Medicare Other | Attending: Cardiology

## 2018-04-28 ENCOUNTER — Telehealth: Payer: Medicare Other | Admitting: Cardiology

## 2018-04-28 DIAGNOSIS — I420 Dilated cardiomyopathy: Secondary | ICD-10-CM | POA: Diagnosis present

## 2018-04-28 DIAGNOSIS — I2510 Atherosclerotic heart disease of native coronary artery without angina pectoris: Secondary | ICD-10-CM | POA: Diagnosis not present

## 2018-04-28 LAB — MYOCARDIAL PERFUSION IMAGING
LV dias vol: 250 mL (ref 62–150)
LV sys vol: 200 mL
Peak HR: 84 {beats}/min
Rest HR: 68 {beats}/min
SDS: 0
SRS: 0
SSS: 0
TID: 1.06

## 2018-04-28 MED ORDER — REGADENOSON 0.4 MG/5ML IV SOLN
0.4000 mg | Freq: Once | INTRAVENOUS | Status: AC
  Administered 2018-04-28: 0.4 mg via INTRAVENOUS

## 2018-04-28 MED ORDER — TECHNETIUM TC 99M TETROFOSMIN IV KIT
9.7000 | PACK | Freq: Once | INTRAVENOUS | Status: AC | PRN
  Administered 2018-04-28: 9.7 via INTRAVENOUS
  Filled 2018-04-28: qty 10

## 2018-04-28 MED ORDER — TECHNETIUM TC 99M TETROFOSMIN IV KIT
32.8000 | PACK | Freq: Once | INTRAVENOUS | Status: AC | PRN
  Administered 2018-04-28: 32.8 via INTRAVENOUS
  Filled 2018-04-28: qty 33

## 2018-04-29 LAB — MULTIPLE MYELOMA PANEL, SERUM
Albumin SerPl Elph-Mcnc: 4 g/dL (ref 2.9–4.4)
Albumin/Glob SerPl: 1.6 (ref 0.7–1.7)
Alpha 1: 0.2 g/dL (ref 0.0–0.4)
Alpha2 Glob SerPl Elph-Mcnc: 0.7 g/dL (ref 0.4–1.0)
B-Globulin SerPl Elph-Mcnc: 0.8 g/dL (ref 0.7–1.3)
Gamma Glob SerPl Elph-Mcnc: 0.8 g/dL (ref 0.4–1.8)
Globulin, Total: 2.6 g/dL (ref 2.2–3.9)
IgA/Immunoglobulin A, Serum: 209 mg/dL (ref 61–437)
IgG (Immunoglobin G), Serum: 862 mg/dL (ref 603–1613)
IgM (Immunoglobulin M), Srm: 82 mg/dL (ref 15–143)

## 2018-04-29 LAB — COMPREHENSIVE METABOLIC PANEL
ALT: 12 IU/L (ref 0–44)
AST: 15 IU/L (ref 0–40)
Albumin/Globulin Ratio: 2.5 — ABNORMAL HIGH (ref 1.2–2.2)
Albumin: 4.7 g/dL — ABNORMAL HIGH (ref 3.6–4.6)
Alkaline Phosphatase: 60 IU/L (ref 39–117)
BUN/Creatinine Ratio: 16 (ref 10–24)
BUN: 26 mg/dL (ref 8–27)
Bilirubin Total: 0.7 mg/dL (ref 0.0–1.2)
CO2: 21 mmol/L (ref 20–29)
Calcium: 9.5 mg/dL (ref 8.6–10.2)
Chloride: 106 mmol/L (ref 96–106)
Creatinine, Ser: 1.58 mg/dL — ABNORMAL HIGH (ref 0.76–1.27)
GFR calc Af Amer: 47 mL/min/{1.73_m2} — ABNORMAL LOW (ref >59)
GFR calc non Af Amer: 40 mL/min/{1.73_m2} — ABNORMAL LOW (ref >59)
Globulin, Total: 1.9 g/dL (ref 1.5–4.5)
Glucose: 107 mg/dL — ABNORMAL HIGH (ref 65–99)
Potassium: 4.4 mmol/L (ref 3.5–5.2)
Sodium: 146 mmol/L — ABNORMAL HIGH (ref 134–144)
Total Protein: 6.6 g/dL (ref 6.0–8.5)

## 2018-04-29 LAB — PRO B NATRIURETIC PEPTIDE: NT-Pro BNP: 10401 pg/mL — ABNORMAL HIGH (ref 0–486)

## 2018-05-03 DIAGNOSIS — I493 Ventricular premature depolarization: Secondary | ICD-10-CM | POA: Diagnosis not present

## 2018-05-03 DIAGNOSIS — I950 Idiopathic hypotension: Secondary | ICD-10-CM | POA: Diagnosis not present

## 2018-05-03 DIAGNOSIS — I472 Ventricular tachycardia: Secondary | ICD-10-CM

## 2018-05-19 ENCOUNTER — Telehealth: Payer: Self-pay | Admitting: *Deleted

## 2018-05-19 NOTE — Telephone Encounter (Signed)
Santiago Glad from Dr. Crisoforo Oxford office called regarding Dr. Joya Gaskins response to clearance form for patient's upcoming EGD dilation scheduled for Friday, 05/22/2018. Dr. Bettina Gavia wrote "I would not do an elective procedure at this time with low EF/CHF/hypotension."   Santiago Glad called per Dr. Crisoforo Oxford request to explain that this is not an elective procedure as the patient has lost nearly 40 pounds (196 to 158 pounds now) and cannot swallow any of his daily medications.   Will have Dr. Bettina Gavia advise with this additional information provided and see if it is okay for patient to have procedure as scheduled and hold aspirin starting today.    Please advise.

## 2018-05-19 NOTE — Telephone Encounter (Signed)
Left message on Tristan Kennedy's voicemail as she requested during previous conversation with the information below per Dr. Bettina Gavia. Advised her to contact our office with any further questions or concerns.

## 2018-05-19 NOTE — Telephone Encounter (Signed)
It sounds like this is not an elective procedure I agree with Tristan Kennedy where she had sent me a note in the Tristan Kennedy can hold aspirin for however long Tristan Kennedy wants him to do it

## 2018-05-21 ENCOUNTER — Encounter: Payer: Self-pay | Admitting: Cardiology

## 2018-05-21 ENCOUNTER — Other Ambulatory Visit: Payer: Self-pay

## 2018-05-21 ENCOUNTER — Telehealth (INDEPENDENT_AMBULATORY_CARE_PROVIDER_SITE_OTHER): Payer: Medicare Other | Admitting: Cardiology

## 2018-05-21 VITALS — BP 107/68 | HR 66 | Ht 69.0 in | Wt 155.0 lb

## 2018-05-21 DIAGNOSIS — I950 Idiopathic hypotension: Secondary | ICD-10-CM | POA: Diagnosis not present

## 2018-05-21 DIAGNOSIS — I493 Ventricular premature depolarization: Secondary | ICD-10-CM

## 2018-05-21 DIAGNOSIS — I420 Dilated cardiomyopathy: Secondary | ICD-10-CM

## 2018-05-21 DIAGNOSIS — I5022 Chronic systolic (congestive) heart failure: Secondary | ICD-10-CM

## 2018-05-21 DIAGNOSIS — I2510 Atherosclerotic heart disease of native coronary artery without angina pectoris: Secondary | ICD-10-CM

## 2018-05-21 NOTE — Progress Notes (Signed)
Virtual Visit via Video Note   This visit type was conducted due to national recommendations for restrictions regarding the COVID-19 Pandemic (e.g. social distancing) in an effort to limit this patient's exposure and mitigate transmission in our community.  Due to his co-morbid illnesses, this patient is at least at moderate risk for complications without adequate follow up.  This format is felt to be most appropriate for this patient at this time.  All issues noted in this document were discussed and addressed.  A limited physical exam was performed with this format.  Please refer to the patient's chart for his consent to telehealth for [facility].   Date:  05/21/2018   ID:  Tristan Kennedy, DOB September 09, 1934, MRN [id number]  Patient Location: Home Provider Location: Office  PCP:  Myrlene Broker, MD  Cardiologist:  No primary care provider on file. Dr Bettina Gavia Electrophysiologist:  None   Evaluation Performed:  Follow-Up Visit  Chief Complaint:  Cardiomyopathy and PVC's on amiodarone  History of Present Illness:    Tristan Kennedy is a 84 y.o. male with a hx of He has a history of mild CAD nonischemic cardiomyopathy with reduced ejection fraction hypertensive heart disease frequent PVCs and hypotension with bradycardia  last seen 04/27/18.  Study Highlights 04/28/18    Nuclear stress EF: 23%.  The left ventricular ejection fraction is severely decreased (<30%).  There was no ST segment deviation noted during stress.  Defect 1: There is a medium defect of moderate severity present in the basal inferior, mid inferior, apical inferior and apex location.  This is a high risk study. Abnormal, high risk stress nuclear study with inferior and apical thinning but no ischemia; EF 23 with global hypokinesis; severe LVE; study high risk due to reduced LV function.    He is pending endoscopy with inability to food and food sticking in his esophagus.  He is lost 40 pounds he has become  quite cachectic and chronically ill and I think that this will be helpful to define if he has no other serious comorbidity.  He is on low-dose amiodarone tolerated for frequent PVCs and previous ventricular tachycardia.  He is mildly breathless sleeps on 2 pillows trace edema.  No chest pain palpitation or syncope.  Blood pressures are greater than 295 systolic on midodrine and has not had syncope.  The patient does not have symptoms concerning for COVID-19 infection (fever, chills, cough, or new shortness of breath).    Past Medical History:  Diagnosis Date   Anxiety    Aortic atherosclerosis ([place]) 04/29/2017   Arthritis    Cardiomyopathy ([place]) 12/12/2016   EF 50% in July 2012 by echo     Depression    Essential hypertension 08/15/2014   Fatigue    Frequent PVCs 08/15/2014   GERD (gastroesophageal reflux disease)    History of bradycardia    as low as the 20's   History of kidney stones    HLD (hyperlipidemia)    HTN (hypertension)    Hyperlipidemia 08/15/2014   Migraines    Mild CAD 08/15/2014   Paroxysmal VT ([place]) 12/12/2016   Prostate cancer (Tristan Kennedy)    PUD (peptic ulcer disease)    Sigmoid diverticulosis 04/29/2017   Marked   Skin cancer    face and back   Spinal stenosis, lumbar    Past Surgical History:  Procedure Laterality Date   CARDIAC CATHETERIZATION     COLONOSCOPY     CYSTOSCOPY N/A 08/11/2017   Procedure:  CYSTOSCOPY;  Surgeon: Kathie Rhodes, MD;  Location: [facility];  Service: Urology;  Laterality: N/A;  NO SEEDS IN BLADDER PER DR OTTELIN   KIDNEY STONE SURGERY  2000   PROSTATE BIOPSY  04/08/2017   RADIOACTIVE SEED IMPLANT N/A 08/11/2017   Procedure: RADIOACTIVE SEED IMPLANT/BRACHYTHERAPY IMPLANT;  Surgeon: Kathie Rhodes, MD;  Location: [facility];  Service: Urology;  Laterality: N/A;  59 SEEDS   SPACE OAR INSTILLATION N/A 08/11/2017   Procedure: SPACE OAR INSTILLATION;  Surgeon: Kathie Rhodes, MD;  Location:  [facility];  Service: Urology;  Laterality: N/A;     No outpatient medications have been marked as taking for the 05/21/18 encounter (Appointment) with Richardo Priest, MD.     Allergies:   Patient has no known allergies.   Social History   Tobacco Use   Smoking status: Never Smoker   Smokeless tobacco: Never Used  Substance Use Topics   Alcohol use: Never    Frequency: Never   Drug use: Never     Family Hx: The patient's family history includes Cancer in his paternal uncle; Diabetes in an other family member; Hypertension in an other family member; Stroke in an other family member.  ROS:   Please see the history of present illness.     All other systems reviewed and are negative.   Prior CV studies:   The following studies were reviewed today:  He will transmit Kardia stripes from home  Labs/Other Tests and Data Reviewed:    EKG:  No ECG reviewed.  Recent Labs: 03/19/2018: TSH 1.510 04/14/2018: B Natriuretic Peptide 1,012.0; Hemoglobin 12.8; Magnesium 2.3; Platelets 151 04/27/2018: ALT 12; BUN 26; Creatinine, Ser 1.58; NT-Pro BNP 10,401; Potassium 4.4; Sodium 146   Recent Lipid Panel Lab Results  Component Value Date/Time   CHOL 146 12/16/2016 03:43 PM   TRIG 162 (H) 12/16/2016 03:43 PM   HDL 48 12/16/2016 03:43 PM   CHOLHDL 3.0 12/16/2016 03:43 PM   LDLCALC 66 12/16/2016 03:43 PM    Wt Readings from Last 3 Encounters:  04/28/18 160 lb (72.6 kg)  04/23/18 160 lb (72.6 kg)  04/14/18 156 lb (70.8 kg)     Objective:    Vital Signs:  There were no vitals taken for this visit.   Constitutional,cachexix chronically ill in no acute distress Vital signs reviewed Eyes, conjunctiva and sclera are normal without pallor or icterus extraocular motions intact and normal there is no lid lag Respiratory, normal effort and excursion no audible wheezing without a stethoscope Cardiovascular, no neck vein distention , trace ankle edema Skin, no rash skin  lesion or ulceration of the extremities Neurologic, cranial nerves II to XII are grossly intact and the patient moves all 4 extremities Neuro/Psychiatric, judgment and thought processes are intact and coherent, alert and oriented x3, mood and affect appear normal.  ASSESSMENT & PLAN:    1. Idiopathic hypotension improved continue his alpha agonist.  Unfortunately his hypotension has precluded vasodilators CKD is precluded MRA bradycardia is precluded beta-blockers and always taking right now for severe cardiomyopathy is on low-dose diuretic.  He has a device to record EKG office smart phone I asked him to record daily send assignment hopefully we can start on a low-dose of beta-blocker recheck renal function tomorrow potassium proBNP and may consider giving him a minimum dose of Entresto. 2. Severe dilated cardiomyopathy recent nuclear perfusion study EF 23% no ischemia.  I do not think heart catheterization and revascularization would be of  benefit 3. Heart failure New York Heart Association class II on minimal therapy see discussion above 4. Frequent PVCs improved continue amiodarone 5. Mild CAD stable.  COVID-19 Education: The signs and symptoms of COVID-19 were discussed with the patient and how to seek care for testing (follow up with PCP or arrange E-visit).  The importance of social distancing was discussed today.  Time:   Today, I have spent 20 minutes with the patient with telehealth technology discussing the above problems.     Medication Adjustments/Labs and Tests Ordered: Current medicines are reviewed at length with the patient today.  Concerns regarding medicines are outlined above.   Tests Ordered: No orders of the defined types were placed in this encounter.   Medication Changes: No orders of the defined types were placed in this encounter.   Disposition:  Follow up in 3 week(s)  Signed, Shirlee More, MD  05/21/2018 8:15 AM    [place]

## 2018-05-21 NOTE — Patient Instructions (Signed)
Medication Instructions:  Your physician recommends that you continue on your current medications as directed. Please refer to the Current Medication list given to you today.   If you need a refill on your cardiac medications before your next appointment, please call your pharmacy.   Lab work: Your physician recommends that you return for lab work in: [place] CMP,TSH,ProBNP  If you have labs (blood work) drawn today and your tests are completely normal, you will receive your results only by: Marland Kitchen MyChart Message (if you have MyChart) OR . A paper copy in the mail If you have any lab test that is abnormal or we need to change your treatment, we will call you to review the results.  Testing/Procedures: None  Follow-Up: At [facility], you and your health needs are our priority.  As part of our continuing mission to provide you with exceptional heart care, we have created designated Provider Care Teams.  These Care Teams include your primary Cardiologist (physician) and Advanced Practice Providers (APPs -  Physician Assistants and Nurse Practitioners) who all work together to provide you with the care you need, when you need it. You will need a follow up appointment in 3 weeks. Any Other Special Instructions Will Be Listed Below (If Applicable).

## 2018-05-22 ENCOUNTER — Telehealth: Payer: Medicare Other | Admitting: Cardiology

## 2018-05-22 ENCOUNTER — Telehealth: Payer: Self-pay

## 2018-05-22 NOTE — Telephone Encounter (Signed)
Patient's wife Clara notified that labs drawn at [facility] has been reviewed by Dr Bettina Gavia and he advised that they are stable with no changes in his medications at this time.  Patient's wife agreed to plan and verbalized understanding.   No further questions.

## 2018-06-01 ENCOUNTER — Telehealth: Payer: Self-pay | Admitting: *Deleted

## 2018-06-01 NOTE — Telephone Encounter (Signed)
Informed patient wife that she could send EKG through my chart and Dr. Bettina Gavia will review

## 2018-06-01 NOTE — Telephone Encounter (Signed)
Pt's wife is calling about importing pt's ekg results to Dr. Bettina Gavia. Routing message to RN.

## 2018-06-02 ENCOUNTER — Telehealth: Payer: Self-pay | Admitting: *Deleted

## 2018-06-02 NOTE — Telephone Encounter (Signed)
Dr. Tor Netters records have been faxed to [facility] office.

## 2018-06-02 NOTE — Telephone Encounter (Signed)
Pt's wife calling to let Dr. Bettina Gavia know that the Amiodarone and midodrine has side effects that pt is experiencing. Side effects are anorexia, nausea, food does not taste good to him, fatigue, muscle weakness, dizziness, difficulty sleeping, abdominal pain, hallucinating onset 5 days ago.  Pt is not doing good at all. Has lost 40 pounds in last year. Please advise. Pt's wife would like a call back from Dr. Joya Gaskins nurse. Saw Dr. [name] today at 12:00. Sent to [facility] because [facility] care team has half day.

## 2018-06-02 NOTE — Telephone Encounter (Signed)
As the patient is not taking those medications now anyway let us just wait to check with Dr. Bettina Gavia in the morning.  Meanwhile I would get records from Dr. [name] office of the patient's evaluation from today and also see if any lab work was done so we can get that report also for Dr. Bettina Gavia in the morning to review.  This message is for [facility] but I have copied Bartolo Darter on it because I am not sure if Hildred Alamin is working this afternoon

## 2018-06-02 NOTE — Telephone Encounter (Signed)
Called Dr. Tor Netters office, they will fax records. I will have them routed to Dr. Bettina Gavia.

## 2018-06-02 NOTE — Telephone Encounter (Signed)
Called patient's wife back. She reports the patient is having increased side effects since starting amiodarone 200 mg daily 04/27/2018 and midodrine 10 mg twice daily since 04/14/2018. He reports having nausea, food doesn't taste good, difficulty sleeping, fatigue, muscle weakness, dizziness, abdominal pain, and hallucinating that started 5 days ago. He has been having issues with anorexia and these medications have made it worse. The patient saw his pcp Dr. [name] today and he as asked him to check with [place] because he thinks the patients issues are from side effects from the medications per patient wife. The patient's wife is requesting we change his medications and reports she is not giving these to him anymore until something changes. Will consult with Dr. Geraldo Pitter as Dr. Bettina Gavia is not in the office.

## 2018-06-03 NOTE — Telephone Encounter (Signed)
Attempted to contact patient's wife, Clara, per DPR on home phone and cell phone with no answer, unable to leave voicemail. Will continue efforts.

## 2018-06-03 NOTE — Telephone Encounter (Signed)
Pt's wife called back about pt and wants to talk to Dr. Bettina Gavia. Nurse said she will call back today.

## 2018-06-03 NOTE — Telephone Encounter (Signed)
Called patient's wife, Clara, back with no answer, unable to leave message.

## 2018-06-03 NOTE — Telephone Encounter (Signed)
Looks like he stopped amiodarone, I would not restart

## 2018-06-04 NOTE — Telephone Encounter (Signed)
Patient's wife, Tristan Kennedy, and patient's daughter, Tristan Kennedy per DPR informed that Dr. Bettina Gavia received and reviewed the records from Dr. [name]' office. Advised them both that patient should stop amiodarone at this time but continue taking midodrine 10 mg twice daily as prescribed due to history of hypotension. Explained that some of his symptoms are caused by low blood pressure and this medication's purpose is to increase BP which should make the patient feel better. Tristan Kennedy and Tristan Kennedy verbalized understanding and are agreeable to plan. Reminded them both of patient's upcoming telephone visit on Wednesday, 06/10/2018, at 4:15 pm. No further questions.

## 2018-06-04 NOTE — Addendum Note (Signed)
Addended by: Austin Miles on: 06/04/2018 08:57 AM   Modules accepted: Orders

## 2018-06-05 ENCOUNTER — Telehealth: Payer: Self-pay | Admitting: Cardiology

## 2018-06-05 NOTE — Telephone Encounter (Signed)
Please call patient's daughter, Kenney Houseman, at [phone] as we discussed Dr. Bettina Gavia. Thanks!

## 2018-06-05 NOTE — Telephone Encounter (Signed)
I have called twice lets squeeze in virtual Tuesday AM to fit Tonya schedule as she has the smart phone

## 2018-06-05 NOTE — Telephone Encounter (Signed)
Fortunately I  had the opportunity to speak to his wife and his daughter who are directing his care he continues to deteriorate he is not eating his week and is been hallucinating.  I told her in my opinion amiodarone is not a problem but I do not think it is effective either and he should remain off of it midodrine is been helpful to sustain his blood pressure he is more short of breath and will take his diuretic daily.  In my opinion he has developed stage D refractory heart failure with severe cardiomyopathy and I cannot treat him with conventional medications other than midodrine and a diuretic.  They told me that they suspected the same that the goal is for him to remain at home and will avoid hospitalization I advised hospice but they told me that they thought he would be reluctant at this time and we decided to use home health care through hospice of [facility] and then we can transition to full hospice as needed.  I am pleased with the family's understanding I think the plan formulated is in the man's best interest I think his heart failure is refractory and care is become palliative.  I will do a video follow-up on Tuesday when the daughter is present to assist [place].

## 2018-06-05 NOTE — Telephone Encounter (Signed)
Patients daughter Kenney Houseman is calling wanting her Dad's appt moved up. She states he is doing very poorly. He is scheduled for the end of next week but would need to be double booked. Please advise.

## 2018-06-09 ENCOUNTER — Other Ambulatory Visit: Payer: Self-pay

## 2018-06-09 ENCOUNTER — Telehealth: Payer: Self-pay | Admitting: Cardiology

## 2018-06-09 ENCOUNTER — Encounter: Payer: Self-pay | Admitting: Cardiology

## 2018-06-09 ENCOUNTER — Telehealth (INDEPENDENT_AMBULATORY_CARE_PROVIDER_SITE_OTHER): Payer: Medicare Other | Admitting: Cardiology

## 2018-06-09 VITALS — BP 95/65 | HR 83 | Ht 69.0 in | Wt 160.0 lb

## 2018-06-09 DIAGNOSIS — I493 Ventricular premature depolarization: Secondary | ICD-10-CM

## 2018-06-09 DIAGNOSIS — I495 Sick sinus syndrome: Secondary | ICD-10-CM

## 2018-06-09 DIAGNOSIS — I110 Hypertensive heart disease with heart failure: Secondary | ICD-10-CM

## 2018-06-09 DIAGNOSIS — I519 Heart disease, unspecified: Secondary | ICD-10-CM

## 2018-06-09 DIAGNOSIS — I420 Dilated cardiomyopathy: Secondary | ICD-10-CM

## 2018-06-09 MED ORDER — AMIODARONE HCL 200 MG PO TABS: 200.0000 mg | ORAL_TABLET | Freq: Every day | ORAL | 3 refills | Status: AC

## 2018-06-09 NOTE — Patient Instructions (Addendum)
Medication Instructions:  Your physician has recommended you make the following change in your medication:  RESTART amiodarone (pacerone) 200 mg: Take 1 tablet daily with evening meal  If you need a refill on your cardiac medications before your next appointment, please call your pharmacy.   Lab work: Your physician recommends that you return for lab work next week: CMP, ProBNP. Please go to our [place] office for lab work, no appointment needed. No need to fast beforehand.   If you have labs (blood work) drawn today and your tests are completely normal, you will receive your results only by: Marland Kitchen MyChart Message (if you have MyChart) OR . A paper copy in the mail If you have any lab test that is abnormal or we need to change your treatment, we will call you to review the results.  Testing/Procedures: You have been referred for [place]. You will be contacted to set this up.   Follow-Up: At [facility], you and your health needs are our priority.  As part of our continuing mission to provide you with exceptional heart care, we have created designated Provider Care Teams.  These Care Teams include your primary Cardiologist (physician) and Advanced Practice Providers (APPs -  Physician Assistants and Nurse Practitioners) who all work together to provide you with the care you need, when you need it. You will need a virtual follow up appointment in 4 weeks: Monday, 07/06/2018, at 9:00 am.

## 2018-06-09 NOTE — Telephone Encounter (Signed)
Virtual Visit Pre-Appointment Phone Call  "(Name), I am calling you today to discuss your upcoming appointment. We are currently trying to limit exposure to the virus that causes COVID-19 by seeing patients at home rather than in the office."  1. "What is the BEST phone number to call the day of the visit?" - include this in appointment notes  2. Do you have or have access to (through a family member/friend) a smartphone with video capability that we can use for your visit?" a. If yes - list this number in appt notes as cell (if different from BEST phone #) and list the appointment type as a VIDEO visit in appointment notes b. If no - list the appointment type as a PHONE visit in appointment notes  Confirm consent - "In the setting of the current Covid19 crisis, you are scheduled for a (phone or video) visit with your provider on (date) at (time).  Just as we do with many in-office visits, in order for you to participate in this visit, we must obtain consent.  If you'd like, I can send this to your mychart (if signed up) or email for you to review.  Otherwise, I can obtain your verbal consent now.  All virtual visits are billed to your insurance company just like a normal visit would be.  By agreeing to a virtual visit, we'd like you to understand that the technology does not allow for your provider to perform an examination, and thus may limit your provider's ability to fully assess your condition. If your provider identifies any concerns that need to be evaluated in person, we will make arrangements to do so.  Finally, though the technology is pretty good, we cannot assure that it will always work on either your or our end, and in the setting of a video visit, we may have to convert it to a phone-only visit.  In either situation, we cannot ensure that we have a secure connection.  Are you willing to proceed?" STAFF: Did the patient verbally acknowledge consent to telehealth visit? Document  YES/NO here: YES 3. Advise patient to be prepared - "Two hours prior to your appointment, go ahead and check your blood pressure, pulse, oxygen saturation, and your weight (if you have the equipment to check those) and write them all down. When your visit starts, your provider will ask you for this information. If you have an Apple Watch or Kardia device, please plan to have heart rate information ready on the day of your appointment. Please have a pen and paper handy nearby the day of the visit as well."  4. Give patient instructions for MyChart download to smartphone OR Doximity/Doxy.me as below if video visit (depending on what platform provider is using)  5. Inform patient they will receive a phone call 15 minutes prior to their appointment time (may be from unknown caller ID) so they should be prepared to answer    TELEPHONE CALL NOTE  Janssen R Stallman has been deemed a candidate for a follow-up tele-health visit to limit community exposure during the Covid-19 pandemic. I spoke with the patient via phone to ensure availability of phone/video source, confirm preferred email & phone number, and discuss instructions and expectations.  I reminded Tristan Kennedy to be prepared with any vital sign and/or heart rhythm information that could potentially be obtained via home monitoring, at the time of his visit. I reminded Andree Golphin Ghosh to expect a phone call prior to his visit.  Janine Limbo 06/09/2018 8:31 AM   INSTRUCTIONS FOR DOWNLOADING THE MYCHART APP TO SMARTPHONE  - The patient must first make sure to have activated MyChart and know their login information - If Apple, go to [employer] and type in MyChart in the search bar and download the app. If Android, ask patient to go to [employer] and type in [place] in the search bar and download the app. The app is free but as with any other app downloads, their phone may require them to verify saved payment information or Apple/Android  password.  - The patient will need to then log into the app with their MyChart username and password, and select [facility] as their healthcare provider to link the account. When it is time for your visit, go to the MyChart app, find appointments, and click Begin Video Visit. Be sure to Select Allow for your device to access the Microphone and Camera for your visit. You will then be connected, and your provider will be with you shortly.  **If they have any issues connecting, or need assistance please contact MyChart service desk [phone])**  **If using a computer, in order to ensure the best quality for their visit they will need to use either of the following Internet Browsers: [employer], or Google Chrome**  IF USING DOXIMITY or DOXY.ME - The patient will receive a link just prior to their visit by text.     FULL LENGTH CONSENT FOR TELE-HEALTH VISIT   I hereby voluntarily request, consent and authorize [facility] and its employed or contracted physicians, physician assistants, nurse practitioners or other licensed health care professionals (the Practitioner), to provide me with telemedicine health care services (the Services") as deemed necessary by the treating Practitioner. I acknowledge and consent to receive the Services by the Practitioner via telemedicine. I understand that the telemedicine visit will involve communicating with the Practitioner through live audiovisual communication technology and the disclosure of certain medical information by electronic transmission. I acknowledge that I have been given the opportunity to request an in-person assessment or other available alternative prior to the telemedicine visit and am voluntarily participating in the telemedicine visit.  I understand that I have the right to withhold or withdraw my consent to the use of telemedicine in the course of my care at any time, without affecting my right to future care or treatment,  and that the Practitioner or I may terminate the telemedicine visit at any time. I understand that I have the right to inspect all information obtained and/or recorded in the course of the telemedicine visit and may receive copies of available information for a reasonable fee.  I understand that some of the potential risks of receiving the Services via telemedicine include:   Delay or interruption in medical evaluation due to technological equipment failure or disruption;  Information transmitted may not be sufficient (e.g. poor resolution of images) to allow for appropriate medical decision making by the Practitioner; and/or   In rare instances, security protocols could fail, causing a breach of personal health information.  Furthermore, I acknowledge that it is my responsibility to provide information about my medical history, conditions and care that is complete and accurate to the best of my ability. I acknowledge that Practitioner's advice, recommendations, and/or decision may be based on factors not within their control, such as incomplete or inaccurate data provided by me or distortions of diagnostic images or specimens that may result from electronic transmissions. I understand that the practice  of medicine is not an Chief Strategy Officer and that Practitioner makes no warranties or guarantees regarding treatment outcomes. I acknowledge that I will receive a copy of this consent concurrently upon execution via email to the email address I last provided but may also request a printed copy by calling the office of [facility].    I understand that my insurance will be billed for this visit.   I have read or had this consent read to me.  I understand the contents of this consent, which adequately explains the benefits and risks of the Services being provided via telemedicine.   I have been provided ample opportunity to ask questions regarding this consent and the Services and have had my questions  answered to my satisfaction.  I give my informed consent for the services to be provided through the use of telemedicine in my medical care  By participating in this telemedicine visit I agree to the above.

## 2018-06-09 NOTE — Addendum Note (Signed)
Addended by: Austin Miles on: 06/09/2018 10:29 AM   Modules accepted: Orders

## 2018-06-09 NOTE — Progress Notes (Signed)
Virtual Visit via Video Note   This visit type was conducted due to national recommendations for restrictions regarding the COVID-19 Pandemic (e.g. social distancing) in an effort to limit this patient's exposure and mitigate transmission in our community.  Due to his co-morbid illnesses, this patient is at least at moderate risk for complications without adequate follow up.  This format is felt to be most appropriate for this patient at this time.  All issues noted in this document were discussed and addressed.  A limited physical exam was performed with this format.  Please refer to the patient's chart for his consent to telehealth for [facility].   Date:  06/09/2018   ID:  Tristan Kennedy, DOB 27-Jan-1934, MRN [id number]  Patient Location: Home Provider Location: Office  PCP:  Myrlene Broker, MD  Cardiologist:  Shirlee More, MD  Electrophysiologist:  None   Evaluation Performed:  Follow-Up Visit  Chief Complaint: For cardiomyopathy and heart failure, on Friday I spoke to the patient's family he has had a profound deterioration in multiple aspects and advised him for palliative care  History of Present Illness:    Tristan Kennedy is a 84 y.o. male with a history of mild CAD nonischemic cardiomyopathy with reduced ejection fraction hypertensive heart disease frequent PVCs and hypotension with bradycardia  last seen 05/21/18.  He continues to deteriorate and a  myocardial perfusion study shows an ejection fraction of 23% with global hypokinesia severe left ventricular enlargement and he has progressed to stage D refractory heart failure.  Associated with this he has had a profound weight loss loss of 40 lbs, loss of  appetite also had an endoscopy with dilation of his esophagus and has had delirium with hallucinations.  I spoke with the family prior to the holiday weekend and encouraged them to seek hospice care.  Because of concerns on their part that amiodarone was part of his  neurologic deterioration it was discontinued.  I told him I did not think it was  responsible.. Also because of shortness of breath his diuretic was increased despite hypotension requiring midodrine.  The patient does not have symptoms concerning for COVID-19 infection (fever, chills, cough, or new shortness of breath).   He has done better over the weekend he is eating again his mood and spirits are better he is not hallucinating and presently not short of breath.  The family had time to think discuss and they agreed to starting with home health or [facility] at [facility] and as needed we will pursue palliative care and hospice.  I think it make him feel better if we put him back on low-dose amiodarone 200 mg a day and I told him they could reduce to 100 mg that they needed to.  He is not in pain no shortness of breath or syncope.  Past Medical History:  Diagnosis Date  . Anxiety   . Aortic atherosclerosis ([place]) 04/29/2017  . Arthritis   . Cardiomyopathy ([place]) 12/12/2016   EF 50% in July 2012 by echo    . Depression   . Essential hypertension 08/15/2014  . Fatigue   . 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 GERD (gastroesophageal reflux disease)   . History of bradycardia    as low as the 20's  . History of kidney stones   . HLD (hyperlipidemia)   . HTN (hypertension)   . Hyperlipidemia 08/15/2014  . Migraines   . Mild CAD 08/15/2014  . Paroxysmal VT ([place]) 12/12/2016  . Prostate  cancer ([place])   . PUD (peptic ulcer disease)   . Sigmoid diverticulosis 04/29/2017   Marked  . Skin cancer    face and back  . Spinal stenosis, lumbar    Past Surgical History:  Procedure Laterality Date  . CARDIAC CATHETERIZATION    . COLONOSCOPY    . CYSTOSCOPY N/A 08/11/2017   Procedure: CYSTOSCOPY;  Surgeon: Kathie Rhodes, MD;  [facility];  Service: Urology;  Laterality: N/A;  NO SEEDS IN BLADDER PER DR OTTELIN  . KIDNEY STONE SURGERY  2000  . PROSTATE BIOPSY  04/08/2017  . RADIOACTIVE  SEED IMPLANT N/A 08/11/2017   Procedure: RADIOACTIVE SEED IMPLANT/BRACHYTHERAPY IMPLANT;  Surgeon: Kathie Rhodes, MD;  Location[facility];  Service: Urology;  Laterality: N/A;  24 SEEDS  . SPACE OAR INSTILLATION N/A 08/11/2017   Procedure: SPACE OAR INSTILLATION;  Surgeon: Kathie Rhodes, MD;  Location: [facility];  Service: Urology;  Laterality: N/A;     No outpatient medications have been marked as taking for the 06/09/18 encounter (Appointment) with Richardo Priest, MD.     Allergies:   Patient has no known allergies.   Social History   Tobacco Use  . Smoking status: Never Smoker  . Smokeless tobacco: Never Used  Substance Use Topics  . Alcohol use: Never    Frequency: Never  . Drug use: Never     Family Hx: The patient's family history includes Cancer in his paternal uncle; Diabetes in an other family member; Hypertension in an other family member; Stroke in an other family member.  ROS:   Please see the history of present illness.     All other systems reviewed and are negative.   Prior CV studies:   The following studies were reviewed today:    Labs/Other Tests and Data Reviewed:    EKG:  No ECG reviewed.  Recent Labs: 05/06/2018 CMP creatinine 1.74 GFR 36 cc stage III CKD potassium 4.7 liver function was normal 05/06/2018 CBC with a hemoglobin of 12.3 platelets mildly reduced 154,000 mild leukopenia white count 4200      NT-Pro BNP 7,780High  03/26/18 6,696High  CM  03/19/18    03/19/2018: TSH 1.510 04/14/2018: B Natriuretic Peptide 1,012.0; Hemoglobin 12.8; Magnesium 2.3; Platelets 151 04/27/2018: ALT 12; BUN 26; Creatinine, Ser 1.58; NT-Pro BNP 10,401; Potassium 4.4; Sodium 146   Recent Lipid Panel Lab Results  Component Value Date/Time   CHOL 146 12/16/2016 03:43 PM   TRIG 162 (H) 12/16/2016 03:43 PM   HDL 48 12/16/2016 03:43 PM   CHOLHDL 3.0 12/16/2016 03:43 PM   LDLCALC 66 12/16/2016 03:43 PM    Wt Readings from Last  3 Encounters:  05/21/18 155 lb (70.3 kg)  04/28/18 160 lb (72.6 kg)  04/23/18 160 lb (72.6 kg)     Objective:    Vital Signs:  There were no vitals taken for this visit.   VITAL SIGNS:  reviewed GEN:  no acute distress EYES:  sclerae anicteric, EOMI - Extraocular Movements Intact RESPIRATORY:  normal respiratory effort, symmetric expansion CARDIOVASCULAR:  no peripheral edema SKIN:  no rash, lesions or ulcers. MUSCULOSKELETAL:  no obvious deformities. NEURO:  alert and oriented x 3, no obvious focal deficit PSYCH:  normal affect  ASSESSMENT & PLAN:    1. Cardiomyopathy severe with heart failure he is intolerant of conventional treatment has progressed to stage ED refractory and palliative care is appropriate.  We will continue his current diuretic and that gives him  comfort check labs next week BMP proBNP he is not a candidate for beta-blockers Entresto or MRA and I think that his overall health and comorbidities precludes consideration of inotropic support or LVAD.  I suspect he will have a respirator and then progressing.  Hospice will be appropriate. 2. Frequent PVCs he wants to restart low-dose amiodarone 3. Sinus node dysfunction stable intolerant of beta-blockers 4. Cachexia associated with cardiac disease and other manifestation of stage D heart failure  COVID-19 Education: The signs and symptoms of COVID-19 were discussed with the patient and how to seek care for testing (follow up with PCP or arrange E-visit).  The importance of social distancing was discussed today.  Time:   Today, I have spent 20 minutes with the patient with telehealth technology discussing the above problems.     Medication Adjustments/Labs and Tests Ordered: Current medicines are reviewed at length with the patient today.  Concerns regarding medicines are outlined above.   Tests Ordered: No orders of the defined types were placed in this encounter.   Medication Changes: No orders of the defined  types were placed in this encounter.   Disposition:  Follow up in 4 week(s)  Signed, Shirlee More, MD  06/09/2018 9:23 AM    [facility]

## 2018-06-10 ENCOUNTER — Telehealth: Payer: Self-pay | Admitting: *Deleted

## 2018-06-10 ENCOUNTER — Telehealth: Payer: Self-pay | Admitting: Cardiology

## 2018-06-10 ENCOUNTER — Telehealth: Payer: Medicare Other | Admitting: Cardiology

## 2018-06-10 NOTE — Telephone Encounter (Signed)
Faxed referral information to [phone] as requested by Gilmore Laroche with [facility].

## 2018-06-10 NOTE — Telephone Encounter (Signed)
Pt is not in agreement with Hospice at this time and would like referral to [place].

## 2018-06-10 NOTE — Telephone Encounter (Signed)
Yes

## 2018-06-10 NOTE — Telephone Encounter (Signed)
Tristan Kennedy, patient's daughter called stating patient's wife is refusing Hospice and wants to know if [facility] will put a referral in to [facility] in [place] for [place].  Please call daughter, Tristan Kennedy to discuss [phone]

## 2018-06-12 ENCOUNTER — Telehealth: Payer: Self-pay | Admitting: Cardiology

## 2018-06-12 NOTE — Telephone Encounter (Signed)
Attempted to contact patient's daughter, Kenney Houseman, with no answer. Unable to leave message. Will continue efforts.

## 2018-06-12 NOTE — Telephone Encounter (Signed)
He may stop his pravastatin.

## 2018-06-12 NOTE — Telephone Encounter (Signed)
Patient is having severe pain in right upper stomach after taking his chlesterol meds and it is a side effect, Please call patient daughter Kenney Houseman.

## 2018-06-12 NOTE — Telephone Encounter (Signed)
Please advise. Thanks.  

## 2018-06-12 NOTE — Telephone Encounter (Signed)
Tristan Kennedy informed that patient can stop taking pravastatin at this time. Tristan Kennedy is agreeable and verbalized understanding. No further questions.

## 2018-06-12 NOTE — Addendum Note (Signed)
Addended by: Austin Miles on: 06/12/2018 01:44 PM   Modules accepted: Orders

## 2018-06-17 ENCOUNTER — Telehealth: Payer: Self-pay | Admitting: *Deleted

## 2018-06-17 NOTE — Telephone Encounter (Signed)
Tye Maryland from [facility] called to let us know that the patient and family are now agreeable to hospice and hospice will go out to his home tomorrow morning at 10:00 am to get him signed up. Provided Cathy with patient's Medicare ID number as requested. No further questions.

## 2018-07-06 ENCOUNTER — Telehealth: Payer: Medicare Other | Admitting: Cardiology

## 2018-07-15 DEATH — deceased

## 2020-05-21 IMAGING — CR DG CHEST 2V
2 series · 2 of 2 positions shown · non-contrast
Comparison: 09/03/2010 report.

CLINICAL DATA: Prostate cancer.

EXAM:
CHEST - 2 VIEW

[w chest pa]
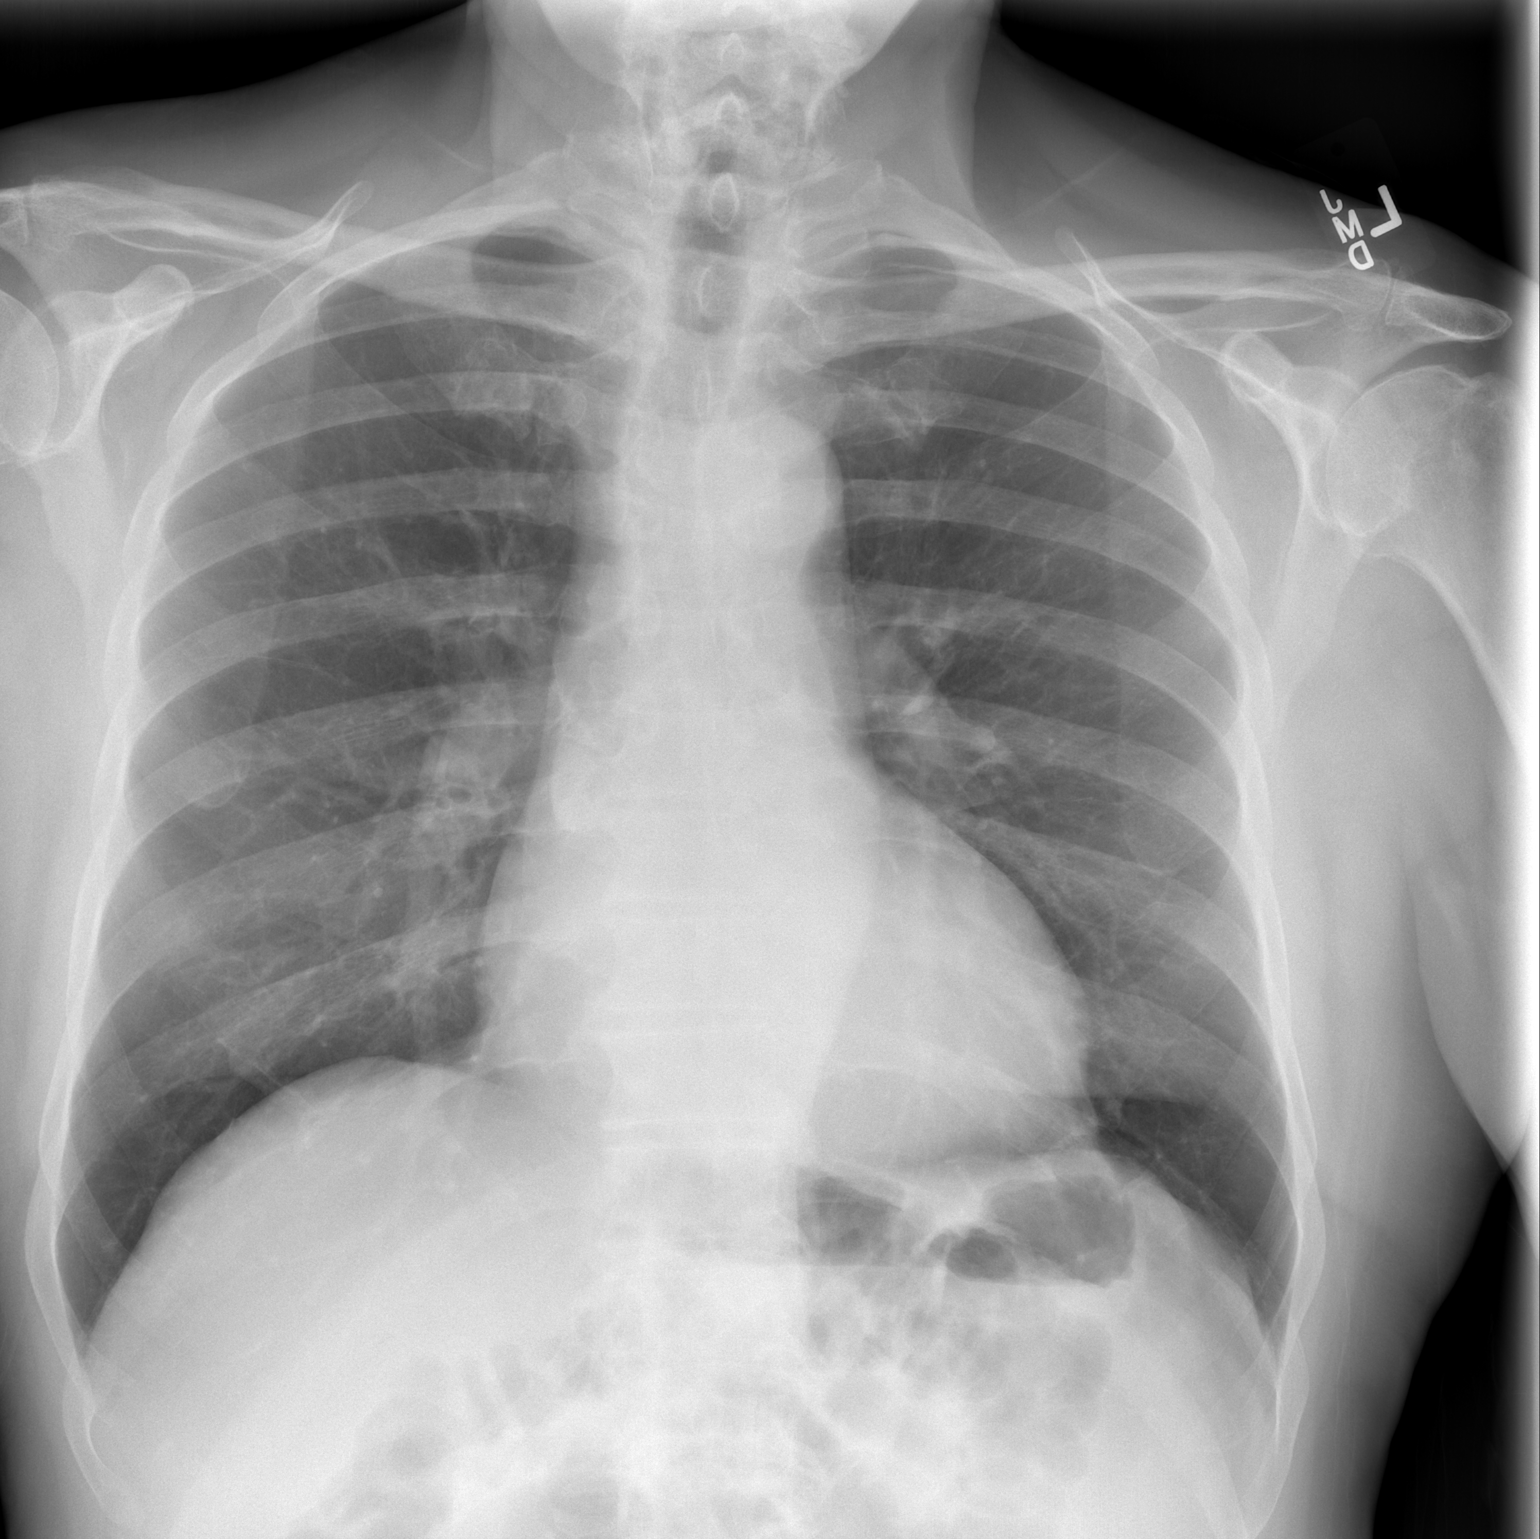

[w chest lat]
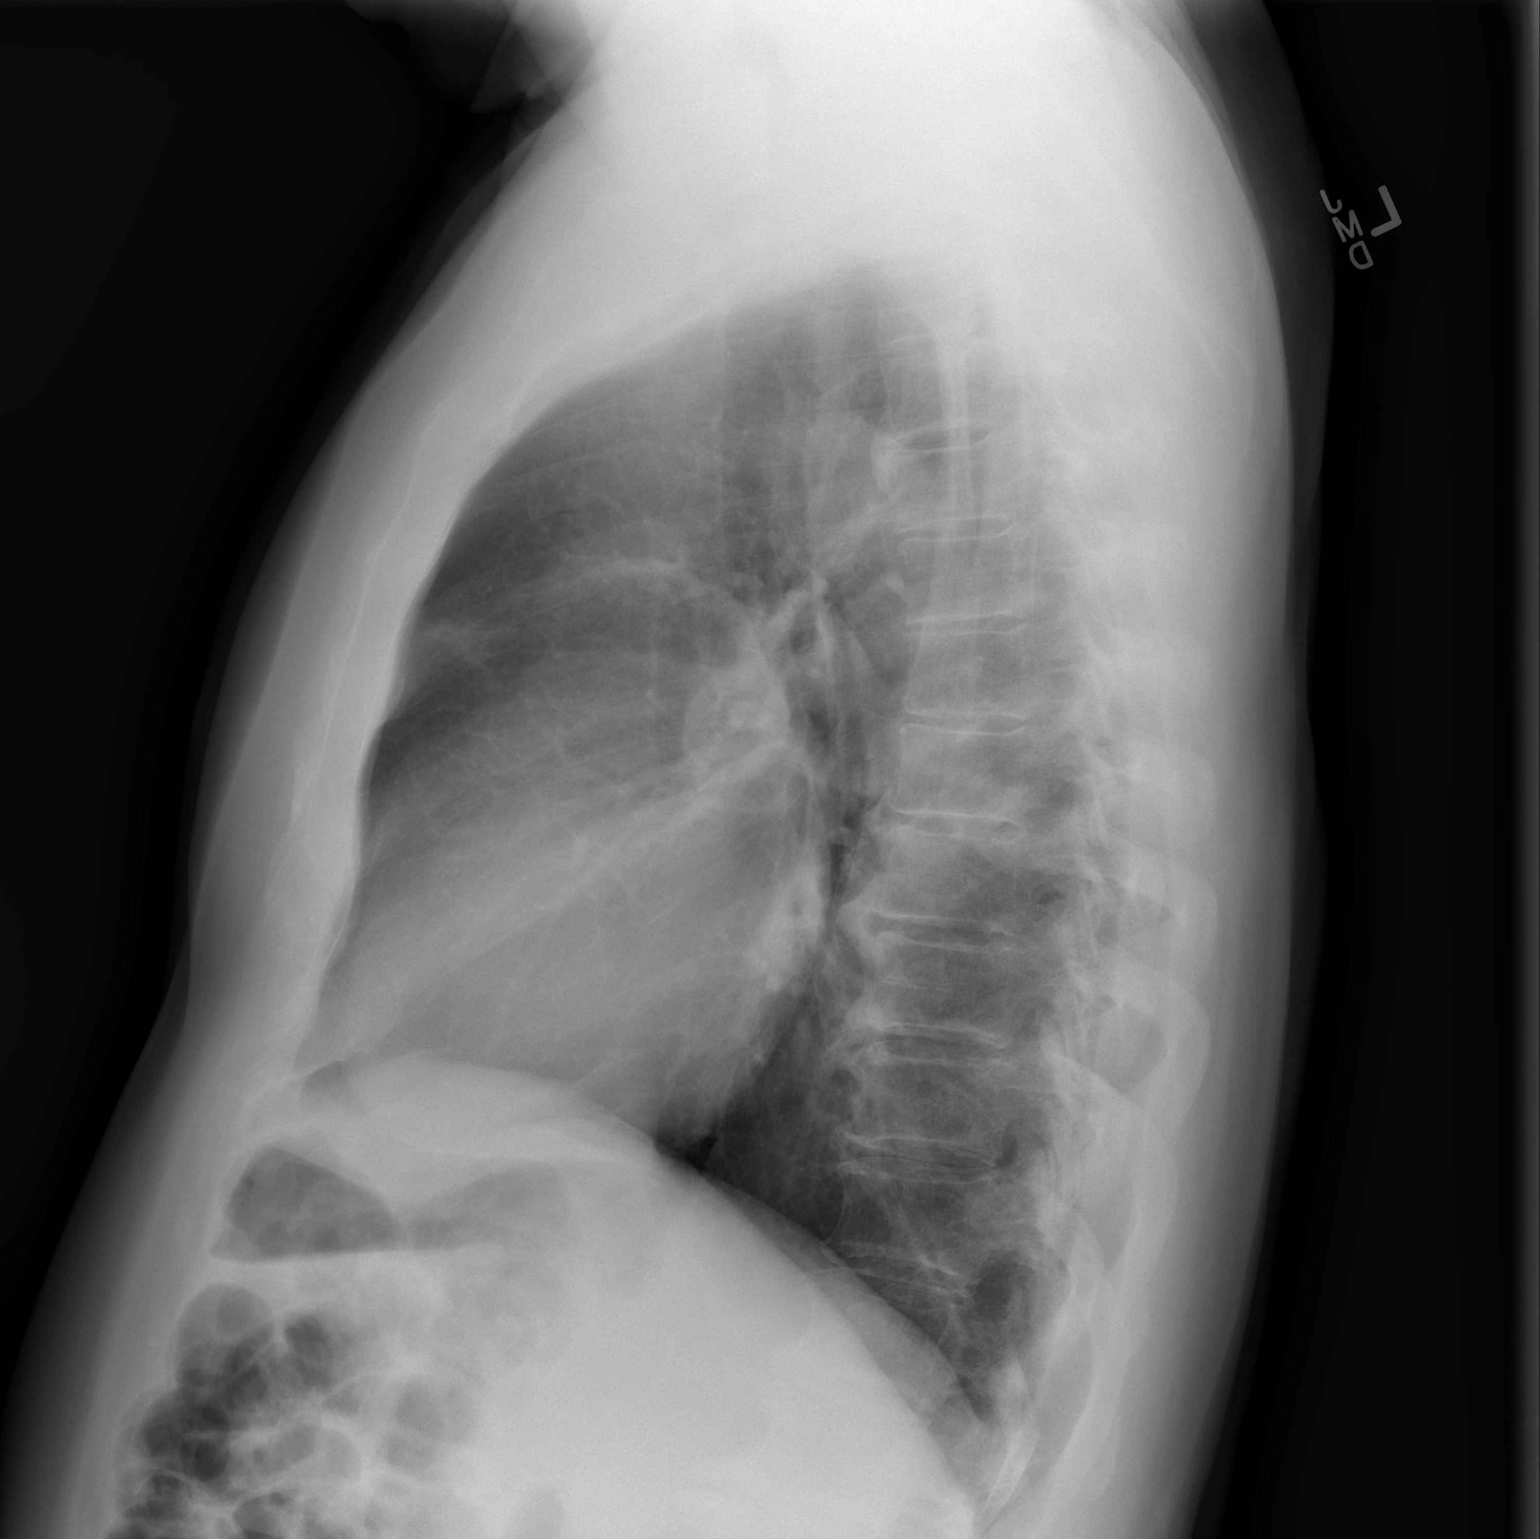

[2 of 2 positions shown; findings below may reference images not displayed]

FINDINGS: Mediastinum and hilar structures normal. Cardiomegaly with normal
pulmonary vascularity. No focal infiltrate. No pleural effusion or
pneumothorax. Degenerative change thoracic spine.
IMPRESSION: No acute cardiopulmonary disease.
